# Patient Record
Sex: Male | Born: 1985 | Race: White | Hispanic: No | Marital: Married | Smoking: Never smoker
Health system: Southern US, Community
[De-identification: ages and names within clinical notes are randomized; demographics above are authoritative.]

## PROBLEM LIST (undated history)

## (undated) DIAGNOSIS — T7840XA Allergy, unspecified, initial encounter: Secondary | ICD-10-CM

## (undated) DIAGNOSIS — K219 Gastro-esophageal reflux disease without esophagitis: Secondary | ICD-10-CM

## (undated) DIAGNOSIS — J45909 Unspecified asthma, uncomplicated: Secondary | ICD-10-CM

## (undated) HISTORY — PX: OTHER SURGICAL HISTORY: SHX169

## (undated) HISTORY — DX: Unspecified asthma, uncomplicated: J45.909

## (undated) HISTORY — DX: Allergy, unspecified, initial encounter: T78.40XA

## (undated) HISTORY — DX: Gastro-esophageal reflux disease without esophagitis: K21.9

## (undated) HISTORY — PX: UPPER GI ENDOSCOPY: SHX6162

---

## 2011-03-19 ENCOUNTER — Emergency Department: Payer: Self-pay | Admitting: Emergency Medicine

## 2012-05-05 ENCOUNTER — Encounter (HOSPITAL_COMMUNITY): Payer: Self-pay | Admitting: Emergency Medicine

## 2012-05-05 ENCOUNTER — Emergency Department (HOSPITAL_COMMUNITY)
Admission: EM | Admit: 2012-05-05 | Discharge: 2012-05-05 | Disposition: A | Discharge status: Home / Self Care | Payer: BC Managed Care – PPO | Attending: Emergency Medicine | Admitting: Emergency Medicine

## 2012-05-05 ENCOUNTER — Emergency Department (HOSPITAL_COMMUNITY): Payer: BC Managed Care – PPO

## 2012-05-05 DIAGNOSIS — Z7982 Long term (current) use of aspirin: Secondary | ICD-10-CM | POA: Insufficient documentation

## 2012-05-05 DIAGNOSIS — F411 Generalized anxiety disorder: Secondary | ICD-10-CM | POA: Insufficient documentation

## 2012-05-05 DIAGNOSIS — R079 Chest pain, unspecified: Secondary | ICD-10-CM | POA: Insufficient documentation

## 2012-05-05 LAB — POCT I-STAT TROPONIN I

## 2012-05-05 LAB — CBC
HCT: 44.3 % (ref 39.0–52.0)
Hemoglobin: 15.7 g/dL (ref 13.0–17.0)
RBC: 5.05 MIL/uL (ref 4.22–5.81)
WBC: 8.5 10*3/uL (ref 4.0–10.5)

## 2012-05-05 LAB — BASIC METABOLIC PANEL
BUN: 13 mg/dL (ref 6–23)
Chloride: 102 mEq/L (ref 96–112)
Glucose, Bld: 106 mg/dL — ABNORMAL HIGH (ref 70–99)
Potassium: 3.9 mEq/L (ref 3.5–5.1)
Sodium: 139 mEq/L (ref 135–145)

## 2012-05-05 MED ORDER — LORAZEPAM 1 MG PO TABS
1.0000 mg | ORAL_TABLET | Freq: Once | ORAL | Status: AC
  Administered 2012-05-05: 1 mg via ORAL
  Filled 2012-05-05: qty 1

## 2012-05-05 MED ORDER — CLONAZEPAM 0.5 MG PO TABS: 0.5000 mg | ORAL_TABLET | Freq: Two times a day (BID) | ORAL | Status: DC | PRN

## 2012-05-05 NOTE — ED Notes (Signed)
Pt states that he has had dull, aching chest pain x 3 days.  Denies SOB.  Pt does not appear to be in any distress.  VSS.  Pain 2/10.

## 2012-05-05 NOTE — ED Notes (Signed)
PA student at bedside.

## 2012-05-05 NOTE — ED Notes (Signed)
Discharge instructions explained to pt ; pt verbalized understanding instructions.

## 2012-05-05 NOTE — ED Provider Notes (Signed)
History     CSN: [id number]  Arrival date & time 05/05/12  1435   First MD Initiated Contact with Patient 05/05/12 1604      Chief Complaint  Patient presents with  . Chest Pain    (Consider location/radiation/quality/duration/timing/severity/associated sxs/prior treatment) HPI Comments: Jonathan Sims is a 27 year old male patient with no cardiac history who presents to the ED complaining of aching, squeezing, non-radiating left sided chest pain that started on 05/03/12 and has been intermittent since that time. He states there's nothing that worsens or improves his chest pain and he begins having pain at random intermittent times.  His pain is a 2 on a scale of 10.  No sob, cough, fever, nausea, vomiting, diarrhea, abdominal pain, recent illness or recent trauma.  He states he does worry a lot and has some anxiety but has never been diagnosed with anxiety.  No recent drug or alcohol use.  He has acid reflux approximately one time per week that's relieved by Tums.    Patient is a 27 y.o. male presenting with chest pain.  Chest Pain Pertinent negatives for primary symptoms include no fever, no fatigue, no shortness of breath, no cough, no wheezing, no palpitations, no abdominal pain, no nausea, no vomiting and no dizziness.  Pertinent negatives for associated symptoms include no diaphoresis.     History reviewed. No pertinent past medical history.  History reviewed. No pertinent past surgical history.  History reviewed. No pertinent family history.  History  Substance Use Topics  . Smoking status: Never Smoker   . Smokeless tobacco: Not on file  . Alcohol Use: No      Review of Systems  Constitutional: Negative for fever, chills, diaphoresis, activity change, fatigue and unexpected weight change.  HENT: Negative for congestion, neck pain and neck stiffness.   Eyes: Negative for visual disturbance.  Respiratory: Negative for apnea, cough, chest tightness, shortness of breath,  wheezing and stridor.   Cardiovascular: Positive for chest pain. Negative for palpitations and leg swelling.  Gastrointestinal: Negative for nausea, vomiting, abdominal pain, diarrhea and blood in stool.  Genitourinary: Negative for dysuria, urgency, hematuria and flank pain.  Musculoskeletal: Negative for myalgias, back pain and gait problem.  Skin: Negative for pallor.  Neurological: Negative for dizziness, syncope, light-headedness and headaches.  Psychiatric/Behavioral: The patient is nervous/anxious.     Allergies  Review of patient's allergies indicates no known allergies.  Home Medications   Current Outpatient Rx  Name  Route  Sig  Dispense  Refill  . ASPIRIN 81 MG PO TBEC   Oral   Take 81 mg by mouth once. Swallow whole.           BP 144/88  Pulse 90  Temp 98.6 F (37 C) (Oral)  Resp 18  SpO2 99%  Physical Exam  Nursing note and vitals reviewed. Constitutional: He appears well-developed and well-nourished. No distress.  HENT:  Head: Normocephalic and atraumatic.  Eyes: Conjunctivae normal and EOM are normal. Pupils are equal, round, and reactive to light.  Neck: Normal range of motion. Neck supple. Normal carotid pulses and no JVD present. Carotid bruit is not present. No rigidity. Normal range of motion present.  Cardiovascular: Normal rate, regular rhythm, S1 normal, S2 normal, normal heart sounds, intact distal pulses and normal pulses.  Exam reveals no gallop and no friction rub.   No murmur heard.      No pitting edema bilaterally, RRR, no aberrant sounds on auscultations, distal pulses intact, no carotid bruit  or JVD.   Pulmonary/Chest: Effort normal and breath sounds normal. No accessory muscle usage or stridor. No respiratory distress. He exhibits no tenderness and no bony tenderness.  Abdominal: Bowel sounds are normal.       Soft non tender. Non pulsatile aorta.   Skin: Skin is warm, dry and intact. No rash noted. He is not diaphoretic. No cyanosis.  Nails show no clubbing.    ED Course  Procedures (including critical care time)  Labs Reviewed  BASIC METABOLIC PANEL - Abnormal; Notable for the following:    Glucose, Bld 106 (*)     All other components within normal limits  CBC  POCT I-STAT TROPONIN I   Dg Chest 2 View  05/05/2012  *RADIOLOGY REPORT*  Clinical Data: Chest pain  CHEST - 2 VIEW  Comparison: None.  Findings: The heart size and mediastinal contours are within normal limits.  Both lungs are clear.  The visualized skeletal structures are unremarkable.  IMPRESSION: Negative examination.   Original Report Authenticated By: Signa Kell, M.D.     Date: 05/05/2012  Rate: 79  Rhythm: normal sinus rhythm  QRS Axis: normal  Intervals: normal  ST/T Wave abnormalities: normal  Conduction Disutrbances: none  Narrative Interpretation:   Old EKG Reviewed: No significant changes noted     No diagnosis found.    MDM  Chest pain  Patient is to be discharged with recommendation to follow up with PCP in regards to today's hospital visit. Chest pain is not likely of cardiac or pulmonary etiology d/t presentation, perc negative, VSS, no tracheal deviation, no JVD or new murmur, RRR, breath sounds equal bilaterally, EKG without acute abnormalities, negative troponin, and negative CXR. Pt has been advised start a PPI and return to the ED is CP becomes exertional, associated with diaphoresis or nausea, radiates to left jaw/arm, worsens or becomes concerning in any way. Pt appears reliable for follow up and is agreeable to discharge.            Jaci Carrel, [place] 05/05/12 1711

## 2012-05-05 NOTE — ED Provider Notes (Signed)
Medical screening examination/treatment/procedure(s) were performed by non-physician practitioner and as supervising physician I was immediately available for consultation/collaboration.   Laray Anger, DO 05/05/12 2045

## 2015-06-06 ENCOUNTER — Emergency Department (HOSPITAL_COMMUNITY)
Admission: EM | Admit: 2015-06-06 | Discharge: 2015-06-07 | Disposition: A | Discharge status: Home / Self Care | Payer: BLUE CROSS/BLUE SHIELD | Attending: Emergency Medicine | Admitting: Emergency Medicine

## 2015-06-06 ENCOUNTER — Encounter (HOSPITAL_COMMUNITY): Payer: Self-pay | Admitting: Emergency Medicine

## 2015-06-06 DIAGNOSIS — Z7951 Long term (current) use of inhaled steroids: Secondary | ICD-10-CM | POA: Insufficient documentation

## 2015-06-06 DIAGNOSIS — J45909 Unspecified asthma, uncomplicated: Secondary | ICD-10-CM | POA: Diagnosis not present

## 2015-06-06 DIAGNOSIS — R0789 Other chest pain: Secondary | ICD-10-CM

## 2015-06-06 DIAGNOSIS — Z79899 Other long term (current) drug therapy: Secondary | ICD-10-CM | POA: Insufficient documentation

## 2015-06-06 DIAGNOSIS — R079 Chest pain, unspecified: Secondary | ICD-10-CM | POA: Diagnosis present

## 2015-06-06 NOTE — ED Notes (Signed)
Patient presents for intermittent centralized chest pain x1 week. Denies radiation, N/V/D, lightheadedness, dizziness, diaphoresis. Rates pain 4/10.

## 2015-06-07 ENCOUNTER — Emergency Department (HOSPITAL_COMMUNITY): Payer: BLUE CROSS/BLUE SHIELD

## 2015-06-07 LAB — CBC
HEMATOCRIT: 45.1 % (ref 39.0–52.0)
Hemoglobin: 14.8 g/dL (ref 13.0–17.0)
MCH: 29.8 pg (ref 26.0–34.0)
MCHC: 32.8 g/dL (ref 30.0–36.0)
MCV: 90.9 fL (ref 78.0–100.0)
Platelets: 180 10*3/uL (ref 150–400)
RBC: 4.96 MIL/uL (ref 4.22–5.81)
RDW: 12.4 % (ref 11.5–15.5)
WBC: 8.6 10*3/uL (ref 4.0–10.5)

## 2015-06-07 LAB — BASIC METABOLIC PANEL
Anion gap: 10 (ref 5–15)
BUN: 22 mg/dL — AB (ref 6–20)
CHLORIDE: 108 mmol/L (ref 101–111)
CO2: 24 mmol/L (ref 22–32)
Calcium: 9.4 mg/dL (ref 8.9–10.3)
Creatinine, Ser: 0.9 mg/dL (ref 0.61–1.24)
GFR calc Af Amer: >60 mL/min (ref >60)
GFR calc non Af Amer: >60 mL/min (ref >60)
Glucose, Bld: 109 mg/dL — ABNORMAL HIGH (ref 65–99)
POTASSIUM: 4.1 mmol/L (ref 3.5–5.1)
Sodium: 142 mmol/L (ref 135–145)

## 2015-06-07 LAB — I-STAT TROPONIN, ED: Troponin i, poc: 0 ng/mL (ref 0.00–0.08)

## 2015-06-07 MED ORDER — KETOROLAC TROMETHAMINE 30 MG/ML IJ SOLN
30.0000 mg | Freq: Once | INTRAMUSCULAR | Status: AC
  Administered 2015-06-07: 30 mg via INTRAVENOUS
  Filled 2015-06-07: qty 1

## 2015-06-07 NOTE — ED Provider Notes (Signed)
CSN: [id number]     Arrival date & time 06/06/15  2346 History  By signing my name below, I, Marisue Humble, attest that this documentation has been prepared under the direction and in the presence of Devoria Albe, MD at 02:05 . Electronically Signed: Marisue Humble, Scribe. 06/07/2015. 3:18 AM.   Chief Complaint  Patient presents with  . Chest Pain   The history is provided by the patient. No language interpreter was used.   HPI Comments:  Jonathan Sims is a 30 y.o. male with PMHx of asthma who presents to the Emergency Department complaining of intermittent, dull, achy left-side chest pain that started 1.5 weeks ago, with episodes lasting 2-3 minutes. Last episode in ER occurred ~5-10 min ago; pt reports no pain currently. No alleviating or exacerbating factors noted. He notes experiencing similar symptoms 3 years ago with no acute dx except stress. 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 Pt notes baseline asthma symptoms treated well with inhaler. Pt denies FHx of heart problems. Pt denies SOB, coughing, fever, nausea, vomiting, diarrhea, sore throat or rhinorrhea.   PCP [facility] in [place]  History reviewed. No pertinent past medical history. History reviewed. No pertinent past surgical history. No family history on file. Social History  Substance Use Topics  . Smoking status: Never Smoker   . Smokeless tobacco: None  . Alcohol Use: No  employed  Review of Systems  Constitutional: Negative for fever.  HENT: Negative for sore throat.   Respiratory: Negative for cough and shortness of breath.   Cardiovascular: Positive for chest pain.  Gastrointestinal: Negative for nausea, vomiting and diarrhea.  All other systems reviewed and are negative.  Allergies  Review of patient's allergies indicates no known allergies.  Home Medications   Prior to Admission medications   Medication Sig Start Date End Date Taking? Authorizing Provider  albuterol (PROVENTIL  HFA;VENTOLIN HFA) 108 (90 Base) MCG/ACT inhaler Inhale 1 puff into the lungs every 6 (six) hours as needed for wheezing or shortness of breath.   Yes Historical Provider, MD  budesonide-formoterol (SYMBICORT) 160-4.5 MCG/ACT inhaler Inhale 2 puffs into the lungs 2 (two) times daily.   Yes Historical Provider, MD  ibuprofen (ADVIL,MOTRIN) 200 MG tablet Take 400-600 mg by mouth every 6 (six) hours as needed for moderate pain.   Yes Historical Provider, MD  ranitidine (ZANTAC) 150 MG tablet Take 150 mg by mouth 2 (two) times daily.   Yes Historical Provider, MD  clonazePAM (KLONOPIN) 0.5 MG tablet Take 1 tablet (0.5 mg total) by mouth 2 (two) times daily as needed for anxiety. Patient not taking: Reported on 06/07/2015 05/05/12   Lisette Paz, PA-C   BP 133/70 mmHg  Pulse 91  Temp(Src) 98.1 F (36.7 C) (Oral)  Resp 17  SpO2 100%  Vital signs normal   Physical Exam  Constitutional: He is oriented to person, place, and time. He appears well-developed and well-nourished.  Non-toxic appearance. He does not appear ill. No distress.  HENT:  Head: Normocephalic and atraumatic.  Right Ear: External ear normal.  Left Ear: External ear normal.  Nose: Nose normal. No mucosal edema or rhinorrhea.  Mouth/Throat: Oropharynx is clear and moist and mucous membranes are normal. No dental abscesses or uvula swelling.  Eyes: Conjunctivae and EOM are normal. Pupils are equal, round, and reactive to light.  Neck: Normal range of motion and full passive range of motion without pain. Neck supple.  Cardiovascular: Normal rate, regular rhythm and normal heart sounds.  Exam reveals no gallop and no friction rub.   No murmur heard. Pulmonary/Chest: Effort normal and breath sounds normal. No respiratory distress. He has no wheezes. He has no rhonchi. He has no rales. He exhibits no tenderness and no crepitus.  Abdominal: Soft. Normal appearance and bowel sounds are normal. He exhibits no distension. There is no  tenderness. There is no rebound and no guarding.  Musculoskeletal: Normal range of motion. He exhibits no edema or tenderness.  Moves all extremities well.   Neurological: He is alert and oriented to person, place, and time. He has normal strength. No cranial nerve deficit.  Skin: Skin is warm, dry and intact. No rash noted. No erythema. No pallor.  Psychiatric: He has a normal mood and affect. His speech is normal and behavior is normal. His mood appears not anxious.  Nursing note and vitals reviewed.  ED Course  Procedures   Medications  ketorolac (TORADOL) 30 MG/ML injection 30 mg (30 mg Intravenous Given 06/07/15 0224)    DIAGNOSTIC STUDIES:  Oxygen Saturation is 100% on RA, normal by my interpretation.    COORDINATION OF CARE:  2:12 AM Discussed lab and imaging results with pt. Will administer medication prior to discharge. Discussed treatment plan with pt at bedside and pt agreed to plan. Patient was given IV Toradol for his atypical chest pain felt to be musculoskeletal more likely exacerbated by stress.  Labs Review Results for orders placed or performed during the hospital encounter of 06/06/15  Basic metabolic panel  Result Value Ref Range   Sodium 142 135 - 145 mmol/L   Potassium 4.1 3.5 - 5.1 mmol/L   Chloride 108 101 - 111 mmol/L   CO2 24 22 - 32 mmol/L   Glucose, Bld 109 (H) 65 - 99 mg/dL   BUN 22 (H) 6 - 20 mg/dL   Creatinine, Ser 4.09 0.61 - 1.24 mg/dL   Calcium 9.4 8.9 - 81.1 mg/dL   GFR calc non Af Amer >60 >60 mL/min   GFR calc Af Amer >60 >60 mL/min   Anion gap 10 5 - 15  CBC  Result Value Ref Range   WBC 8.6 4.0 - 10.5 K/uL   RBC 4.96 4.22 - 5.81 MIL/uL   Hemoglobin 14.8 13.0 - 17.0 g/dL   HCT 91.4 78.2 - 95.6 %   MCV 90.9 78.0 - 100.0 fL   MCH 29.8 26.0 - 34.0 pg   MCHC 32.8 30.0 - 36.0 g/dL   RDW 21.3 08.6 - 57.8 %   Platelets 180 150 - 400 K/uL  I-stat troponin, ED (not at [facility])  Result Value Ref Range   Troponin i, poc 0.00 0.00 - 0.08  ng/mL   Comment 3            Laboratory interpretation all normal Dg Chest 2 View  06/07/2015  CLINICAL DATA:  30 year old male with intermittent central chest pain. EXAM: CHEST  2 VIEW COMPARISON:  Radiograph dated 05/05/2012 FINDINGS: The heart size and mediastinal contours are within normal limits. Both lungs are clear. The visualized skeletal structures are unremarkable. IMPRESSION: No active cardiopulmonary disease. Electronically Signed   By: Elgie Collard M.D.   On: 06/07/2015 00:25    EKG Interpretation  Date/Time:  Saturday June 06 2015 23:57:20 EST Ventricular Rate:  89 PR Interval:  176 QRS Duration: 96 QT Interval:  348 QTC Calculation: 423 R Axis:   58 Text Interpretation:  Sinus rhythm Inferior Q waves noted probable normal  variant No significant  change since last tracing 05 May 2012 Confirmed by  [facility]  MD-I, Aser Nylund ([id number]) on 06/07/2015 12:02:10 AM     Medications  ketorolac (TORADOL) 30 MG/ML injection 30 mg (30 mg Intravenous Given 06/07/15 0224)    MDM   Final diagnoses:  Atypical chest pain   Patient to take over-the-counter Motrin or Aleve for his pain.  Plan discharge  .Devoria Albe, MD, FACEP    I personally performed the services described in this documentation, which was scribed in my presence. The recorded information has been reviewed and considered.  Devoria Albe, MD, Concha Pyo, MD 06/07/15 [phone]

## 2015-06-07 NOTE — Discharge Instructions (Signed)
You can take ibuprofen 600 mg 4 times a day OR aleve 2 tabs twice a day for pain. Recheck if you get a fever, struggle to breathe or have severe constant pain lasting more than 30 mintues.    Chest Wall Pain Chest wall pain is pain in or around the bones and muscles of your chest. Sometimes, an injury causes this pain. Sometimes, the cause may not be known. This pain may take several weeks or longer to get better. HOME CARE Pay attention to any changes in your symptoms. Take these actions to help with your pain:  Rest as told by your doctor.  Avoid activities that cause pain. Try not to use your chest, belly (abdominal), or side muscles to lift heavy things.  If directed, apply ice to the painful area:  Put ice in a plastic bag.  Place a towel between your skin and the bag.  Leave the ice on for 20 minutes, 2-3 times per day.  Take over-the-counter and prescription medicines only as told by your doctor.  Do not use tobacco products, including cigarettes, chewing tobacco, and e-cigarettes. If you need help quitting, ask your doctor.  Keep all follow-up visits as told by your doctor. This is important. GET HELP IF:  You have a fever.  Your chest pain gets worse.  You have new symptoms. GET HELP RIGHT AWAY IF:  You feel sick to your stomach (nauseous) or you throw up (vomit).  You feel sweaty or light-headed.  You have a cough with phlegm (sputum) or you cough up blood.  You are short of breath.   This information is not intended to replace advice given to you by your health care provider. Make sure you discuss any questions you have with your health care provider.   Document Released: 09/28/2007 Document Revised: 12/31/2014 Document Reviewed: 07/07/2014 Elsevier Interactive Patient Education [employer]

## 2015-08-08 DIAGNOSIS — F419 Anxiety disorder, unspecified: Secondary | ICD-10-CM | POA: Insufficient documentation

## 2015-08-10 DIAGNOSIS — E669 Obesity, unspecified: Secondary | ICD-10-CM | POA: Insufficient documentation

## 2015-08-10 DIAGNOSIS — E66811 Obesity, class 1: Secondary | ICD-10-CM | POA: Insufficient documentation

## 2016-05-05 ENCOUNTER — Encounter (INDEPENDENT_AMBULATORY_CARE_PROVIDER_SITE_OTHER): Payer: Self-pay

## 2016-05-05 ENCOUNTER — Ambulatory Visit: Payer: Self-pay | Admitting: Allergy & Immunology

## 2016-05-05 ENCOUNTER — Ambulatory Visit (INDEPENDENT_AMBULATORY_CARE_PROVIDER_SITE_OTHER): Payer: BLUE CROSS/BLUE SHIELD | Admitting: Allergy & Immunology

## 2016-05-05 ENCOUNTER — Encounter: Payer: Self-pay | Admitting: Allergy & Immunology

## 2016-05-05 VITALS — BP 150/88 | HR 74 | Temp 98.1°F | Resp 18 | Ht 70.0 in | Wt 259.8 lb

## 2016-05-05 DIAGNOSIS — J3089 Other allergic rhinitis: Secondary | ICD-10-CM

## 2016-05-05 DIAGNOSIS — J4540 Moderate persistent asthma, uncomplicated: Secondary | ICD-10-CM

## 2016-05-05 NOTE — Patient Instructions (Addendum)
1. Moderate persistent asthma, uncomplicated - Lung testing was normal today. - However since you're using her albuterol so frequently, we will change it to a different medication: Symbicort 160/4.5 - Daily controller medication(s): Symbicort 160/4.5 two puffs twice daily via spacer - Rescue medications: ProAir 4 puffs every 4-6 hours as needed - Asthma control goals:  * Full participation in all desired activities (may need albuterol before activity) * Albuterol use two time or less a week on average (not counting use with activity) * Cough interfering with sleep two time or less a month * Oral steroids no more than once a year * No hospitalizations  2. Chronic rhinitis - Testing was positive to grasses, weeds, trees, molds, cat, dog, dust mite, cockroach - Start Flonase one spray per nostril daily. - Start Astelin one spray per nostril daily. - Use an antihistamine daily for breakthrough symptoms as needed (Zyrtec, Allegra, or Xyzal) - Consider allergy shots if there is no improvement.  - Call you insurance company to confirm coverage.  - You can purchase Flonase (fluticasone) on [employer] for very reasonable prices. - You can also purchase antihistamines (cetirizine and fexofenadine) on [employer] for reasonable prices.  3. Return in about 4 weeks (around 06/02/2016).  Please inform us of any Emergency Department visits, hospitalizations, or changes in symptoms. Call us before going to the ED for breathing or allergy symptoms since we might be able to fit you in for a sick visit. Feel free to contact us anytime with any questions, problems, or concerns.  It was a pleasure to meet you today! Best wishes in the [place] Year!   Websites that have reliable patient information: 1. American Academy of Asthma, Allergy, and Immunology: www.aaaai.org 2. Food Allergy Research and Education (FARE): foodallergy.org 3. Mothers of Asthmatics: http://www.asthmacommunitynetwork.org 4. American College of  Allergy, Asthma, and Immunology: www.acaai.org  Control of Mold Allergen  Mold and fungi can grow on a variety of surfaces provided certain temperature and moisture conditions exist.  Outdoor molds grow on plants, decaying vegetation and soil.  The major outdoor mold, Alternaria and Cladosporium, are found in very high numbers during hot and dry conditions.  Generally, a late Summer - Fall peak is seen for common outdoor fungal spores.  Rain will temporarily lower outdoor mold spore count, but counts rise rapidly when the rainy period ends.  The most important indoor molds are Aspergillus and Penicillium.  Dark, humid and poorly ventilated basements are ideal sites for mold growth.  The next most common sites of mold growth are the bathroom and the kitchen.  Outdoor [employer] 1. Use air conditioning and keep windows closed 2. Avoid exposure to decaying vegetation. 3. Avoid leaf raking. 4. Avoid grain handling. 5. Consider wearing a face mask if working in moldy areas.  Indoor Mold Control 1. Maintain humidity below 50%. 2. Clean washable surfaces with 5% bleach solution. 3. Remove sources e.g. contaminated carpets.  Reducing Pollen Exposure  The American Academy of Allergy, Asthma and Immunology suggests the following steps to reduce your exposure to pollen during allergy seasons.    1. Do not hang sheets or clothing out to dry; pollen may collect on these items. 2. Do not mow lawns or spend time around freshly cut grass; mowing stirs up pollen. 3. Keep windows closed at night.  Keep car windows closed while driving. 4. Minimize morning activities outdoors, a time when pollen counts are usually at their highest. 5. Stay indoors as much as possible when pollen counts or  humidity is high and on windy days when pollen tends to remain in the air longer. 6. Use air conditioning when possible.  Many air conditioners have filters that trap the pollen spores. 7. Use a HEPA room air filter to  remove pollen form the indoor air you breathe.  Control of Dog or Cat Allergen  Avoidance is the best way to manage a dog or cat allergy. If you have a dog or cat and are allergic to dog or cats, consider removing the dog or cat from the home. If you have a dog or cat but don't want to find it a new home, or if your family wants a pet even though someone in the household is allergic, here are some strategies that may help keep symptoms at bay:  1. Keep the pet out of your bedroom and restrict it to only a few rooms. Be advised that keeping the dog or cat in only one room will not limit the allergens to that room. 2. Don't pet, hug or kiss the dog or cat; if you do, wash your hands with soap and water. 3. High-efficiency particulate air (HEPA) cleaners run continuously in a bedroom or living room can reduce allergen levels over time. 4. Regular use of a high-efficiency vacuum cleaner or a central vacuum can reduce allergen levels. 5. Giving your dog or cat a bath at least once a week can reduce airborne allergen.  Control of Cockroach Allergen  Cockroach allergen has been identified as an important cause of acute attacks of asthma, especially in urban settings.  There are fifty-five species of cockroach that exist in the [place], however only three, the [place], [place] and Oriental species produce allergen that can affect patients with Asthma.  Allergens can be obtained from fecal particles, egg casings and secretions from cockroaches.    1. Remove food sources. 2. Reduce access to water. 3. Seal access and entry points. 4. Spray runways with 0.5-1% Diazinon or Chlorpyrifos 5. Blow boric acid power under stoves and refrigerator. 6. Place bait stations (hydramethylnon) at feeding sites.

## 2016-05-05 NOTE — Progress Notes (Signed)
NEW PATIENT  Date of Service/Encounter:  05/05/16   Assessment:   Moderate persistent asthma, uncomplicated  Chronic nonseasonal allergic rhinitis due to fungal spores   Elevated blood pressure - not on antihypertensives   Asthma Reportables:  Severity: moderate persistent  Risk: low Control: not well controlled  Seasonal Influenza Vaccine: yes    Plan/Recommendations:   1. Moderate persistent asthma, uncomplicated - Lung testing was normal today. - However since you're using her albuterol so frequently, we will change it to a different medication: Symbicort 160/4.5 - Daily controller medication(s): Symbicort 160/4.5 two puffs twice daily via spacer - Rescue medications: ProAir 4 puffs every 4-6 hours as needed - Asthma control goals:  * Full participation in all desired activities (may need albuterol before activity) * Albuterol use two time or less a week on average (not counting use with activity) * Cough interfering with sleep two time or less a month * Oral steroids no more than once a year * No hospitalizations  2. Chronic allergic rhinitis - Testing was positive to grasses, weeds, trees, molds, cat, dog, dust mite, cockroach - Start Flonase one spray per nostril daily. - Start Astelin one spray per nostril daily. - Use an antihistamine daily for breakthrough symptoms as needed (Zyrtec, Allegra, or Xyzal) - Consider allergy shots if there is no improvement.  - Call you insurance company to confirm coverage.  - You can purchase Flonase (fluticasone) on [employer] for very reasonable prices. - You can also purchase antihistamines (cetirizine and fexofenadine) on [employer] for reasonable prices.  3. Elevated blood pressure - Defer to PCP for management.  - I recommended that Jonathan Sims talk to his PCP about this.  4. Return in about 4 weeks (around 06/02/2016).   Subjective:   Jonathan Sims is a 31 y.o. male presenting today for evaluation of  Chief Complaint    Patient presents with  . Allergy Testing    Environmental    Jonathan Sims has a history of the following: There are no active problems to display for this patient.   History obtained from: chart review and patient.  Jonathan Sims was referred by Nonnie Done., MD.     Jonathan Sims is a 31 y.o. male presenting for an asthma and allergy evaluation. The history was obtained from the EMR as well as the patient. Jonathan Sims has had asthma for a long period of time. He has never been evaluated by an asthma and allergy specialist. He has been on Flovent Diskus for a long time, although he does not feel that it is working well.   Lately, Jonathan Sims has been having SOB since November that does help with administration of the ProAir. He has also been on Singulair 10 mg for a few months, however he has not taken it in a month and feels no worse. He has been using his ProAir are fairly routinely multiple times a day since November. He is required no ER visits or prednisone courses aside from one course of steroids in November. The steroids do seem to help his symptoms. Sometimes the albuterol helps another 10 to provides no relief. He does not like how extensive the Flovent is a $40 per month. He has never been hospitalized for his asthma.  Jonathan Sims reports nasal congestion throughout the year. This seems to come and go and he can occasionally breathe through his nose. He has been using Claritin and Allegra without improvement. He has been on Flonase but this was  a while ago. He did not like the feel of the medication but did not mind the scent. He is not using nasal saline currently. He does have itchy watery eyes that occur randomly. This occurs around 1-2 times per week. There is a cat at home but he is unsure whether this is worsening his symptoms. Symptoms do tend to be worse on the weekends when he goes to his dad's home who has three cats. Cigarette smoking tends to make things worse.   He does  have a history of elevated blood pressure, but he has never been started on a medication. He has a history of food allergies and tolerates all of the 8 major food allergens. Otherwise, there is no history of other atopic diseases, including asthma, drug allergies, food allergies, environmental allergies, stinging insect allergies, or urticaria. There is no significant infectious history. Vaccinations are up to date.    Past Medical History: There are no active problems to display for this patient.   Medication List:  Allergies as of 05/05/2016   No Known Allergies     Medication List       Accurate as of 05/05/16  4:53 PM. Always use your most recent med list.          albuterol 108 (90 Base) MCG/ACT inhaler Commonly known as:  PROVENTIL HFA;VENTOLIN HFA Inhale 1 puff into the lungs every 6 (six) hours as needed for wheezing or shortness of breath.   ibuprofen 200 MG tablet Commonly known as:  ADVIL,MOTRIN Take 400-600 mg by mouth every 6 (six) hours as needed for moderate pain.   ranitidine 150 MG tablet Commonly known as:  ZANTAC Take 150 mg by mouth 2 (two) times daily.       Birth History: non-contributory.   Developmental History: non-contributory.   Past Surgical History: Past Surgical History:  Procedure Laterality Date  . Wisdom surgery       Family History: Family History  Problem Relation Age of Onset  . Allergic rhinitis Father      Social History: Jonathan Sims lives at home with his girlfriend. 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 He has been there for three years. Prior to that, he was working in [employer]. He is always worked in very dusty environments, but his symptoms have acutely worsened over the past 2-3 years.   Review of Systems: a 14-point review of systems is pertinent for what is mentioned in HPI.  Otherwise, all other systems were negative. Constitutional: negative  other than that listed in the HPI Eyes: negative other than that listed in the HPI Ears, nose, mouth, throat, and face: negative other than that listed in the HPI Respiratory: negative other than that listed in the HPI Cardiovascular: negative other than that listed in the HPI Gastrointestinal: negative other than that listed in the HPI Genitourinary: negative other than that listed in the HPI Integument: negative other than that listed in the HPI Hematologic: negative other than that listed in the HPI Musculoskeletal: negative other than that listed in the HPI Neurological: negative other than that listed in the HPI Allergy/Immunologic: negative other than that listed in the HPI    Objective:   Blood pressure (!) 150/88, pulse 74, temperature 98.1 F (36.7 C), temperature source Oral, resp. rate 18, height 5\' 10"  (1.778 m), weight 259 lb 12.8 oz (117.8 kg), SpO2 96 %. Body mass index is 37.28 kg/m.   Physical Exam:  General: Alert, interactive, in no acute distress. Obese male. Cooperative with exam. Eyes: Conjunctival injection on the right with limbal sparing, Conjunctival injection on the left with limbal sparing, PERRL bilaterally, No discharge on the right, No discharge on the left and No Horner-Trantas dots present Ears: Right TM unable to be visualized due to cerumen impaction and Left TM unable to be visualized due to cerumen impaction.  Nose/Throat: External nose within normal limits, nasal crease present and septum midline, turbinates markedly edematous and pale with clear discharge, post-pharynx markedly erythematous with cobblestoning in the posterior oropharynx. Tonsils 3+ without exudates Neck: Supple without thyromegaly. Adenopathy: no enlarged lymph nodes appreciated in the anterior cervical, occipital, axillary, epitrochlear, inguinal, or popliteal regions Lungs: Clear to auscultation without wheezing, rhonchi or rales. No increased work of breathing. CV: Normal  S1/S2, no murmurs. Capillary refill <2 seconds.  Abdomen: Nondistended, nontender. No guarding or rebound tenderness. Bowel sounds faint and present in all fields  Skin: Warm and dry, without lesions or rashes. Extremities:  No clubbing, cyanosis or edema. Neuro:   Grossly intact. No focal deficits appreciated. Responsive to questions.  Diagnostic studies:  Spirometry: results normal (FEV1: 4.12/93%, FVC: 5.85/110%, FEV1/FVC: 70%).    Spirometry consistent with normal pattern.   Allergy Studies:   Indoor/Outdoor Percutaneous Adult Environmental Panel: positive to bahia grass, [place] grass, johnson grass, [place] blue grass, meadow fescue grass, perennial rye grass, sweet vernal grass, timothy grass, cocklebur, short ragweed, giant ragweed, English plantain, sheep sorrel, common mugwort, birch, Box elder, red cedar, [place] cottonwood, elm, oak, pecan pollen, black walnut pollen, Alternaria, Cladosporium, Aspergillus, Fusarium and cat. Otherwise negative with adequate controls.  Indoor/Outdoor Selected Intradermal Environmental Panel: positive to dog, cockroach and mite mix. Otherwise negative with adequate controls.      Malachi Bonds, MD FAAAAI [facility]

## 2016-05-06 MED ORDER — AZELASTINE HCL 0.1 % NA SOLN
1.0000 | Freq: Two times a day (BID) | NASAL | 12 refills | Status: DC
Start: 2016-05-06 — End: 2017-08-17

## 2016-05-06 NOTE — Addendum Note (Signed)
Addended by: Mliss FritzBLACK, Bryden Darden I on: 05/06/2016 04:46 PM   Modules accepted: Orders

## 2016-05-16 ENCOUNTER — Other Ambulatory Visit: Payer: Self-pay | Admitting: Allergy & Immunology

## 2016-05-16 MED ORDER — BUDESONIDE-FORMOTEROL FUMARATE 160-4.5 MCG/ACT IN AERO: 2.0000 | INHALATION_SPRAY | Freq: Two times a day (BID) | RESPIRATORY_TRACT | 5 refills | Status: DC

## 2016-05-16 NOTE — Telephone Encounter (Signed)
Called patient and informed that we sent the Symbicort 160 to [facility] on [address].

## 2016-05-16 NOTE — Telephone Encounter (Signed)
Patient saw Dr. Dellis AnesGallagher on 05-05-16. He was given a sample of Symbicort to see if it helped it did and patient would like a prescription called in for it to [facility] on [address].

## 2016-06-02 ENCOUNTER — Ambulatory Visit (INDEPENDENT_AMBULATORY_CARE_PROVIDER_SITE_OTHER): Payer: BLUE CROSS/BLUE SHIELD | Admitting: Allergy & Immunology

## 2016-06-02 ENCOUNTER — Encounter: Payer: Self-pay | Admitting: Allergy & Immunology

## 2016-06-02 VITALS — BP 132/88 | HR 84 | Temp 98.0°F | Ht 70.0 in | Wt 259.7 lb

## 2016-06-02 DIAGNOSIS — J4540 Moderate persistent asthma, uncomplicated: Secondary | ICD-10-CM

## 2016-06-02 DIAGNOSIS — K219 Gastro-esophageal reflux disease without esophagitis: Secondary | ICD-10-CM

## 2016-06-02 DIAGNOSIS — J3089 Other allergic rhinitis: Secondary | ICD-10-CM

## 2016-06-02 NOTE — Patient Instructions (Addendum)
1. Moderate persistent asthma, uncomplicated - Lung testing was normal today. - We will not make any medication changes today.  - Daily controller medication(s): Symbicort 160/4.5 two puffs twice daily via spacer - Rescue medications: ProAir 4 puffs every 4-6 hours as needed - Asthma control goals:  * Full participation in all desired activities (may need albuterol before activity) * Albuterol use two time or less a week on average (not counting use with activity) * Cough interfering with sleep two time or less a month * Oral steroids no more than once a year * No hospitalizations  2. Chronic rhinitis (grasses, weeds, trees, molds, cat, dog, dust mite, cockroach) - Continue with Flonase one spray per nostril daily. - Continue with an antihistamine daily for breakthrough symptoms as needed (Zyrtec, Allegra, or Xyzal) - Consider allergy shots if there is no improvement.  - You can purchase Flonase (fluticasone) on [employer] for very reasonable prices. - You can also purchase antihistamines (cetirizine and fexofenadine) on [employer] for reasonable prices.  3. Abdominal pain - reflux?  - Stop the Zantac and start Dexilant 30mg  once daily. - Call us in a couple of weeks to let us know if this provides better control. - We will send in a prescription at that time.   4. Return in about 6 months (around 11/30/2016).  Please inform us of any Emergency Department visits, hospitalizations, or changes in symptoms. Call us before going to the ED for breathing or allergy symptoms since we might be able to fit you in for a sick visit. Feel free to contact us anytime with any questions, problems, or concerns.  It was a pleasure to see you again today! Best wishes in the [place] Year!   Websites that have reliable patient information: 1. American Academy of Asthma, Allergy, and Immunology: www.aaaai.org 2. Food Allergy Research and Education (FARE): foodallergy.org 3. Mothers of Asthmatics:  http://www.asthmacommunitynetwork.org 4. American College of Allergy, Asthma, and Immunology: www.acaai.org

## 2016-06-02 NOTE — Progress Notes (Signed)
FOLLOW UP  Date of Service/Encounter:  06/02/16   Assessment:   Moderate persistent asthma, uncomplicated  Chronic nonseasonal allergic rhinitis   Gastroesophageal reflux disease   Asthma Reportables:  Severity: moderate persistent  Risk: low Control: well controlled  Seasonal Influenza Vaccine: yes    Plan/Recommendations:   1. Moderate persistent asthma, uncomplicated - Lung testing was normal today. - We will not make any medication changes today.  - Daily controller medication(s): Symbicort 160/4.5 two puffs twice daily via spacer - Rescue medications: ProAir 4 puffs every 4-6 hours as needed - Asthma control goals:  * Full participation in all desired activities (may need albuterol before activity) * Albuterol use two time or less a week on average (not counting use with activity) * Cough interfering with sleep two time or less a month * Oral steroids no more than once a year * No hospitalizations  2. Chronic rhinitis (grasses, weeds, trees, molds, cat, dog, dust mite, cockroach) - Continue with Flonase one spray per nostril daily. - Continue with an antihistamine daily for breakthrough symptoms as needed (Zyrtec, Allegra, or Xyzal) - Consider allergy shots if there is no improvement.  - You can purchase Flonase (fluticasone) on [employer] for very reasonable prices. - You can also purchase antihistamines (cetirizine and fexofenadine) on [employer] for reasonable prices.  3. Abdominal pain - reflux?  - Stop the Zantac and start Dexilant 30mg  once daily. - Call us in a couple of weeks to let us know if this provides better control. - We will send in a prescription at that time.   4. Return in about 6 months (around 11/30/2016).   Subjective:   Jonathan Sims is a 31 y.o. male presenting today for follow up of  Chief Complaint  Patient presents with  . Allergic Rhinitis     Well controlled by nasal sprays and Zrytec.     Jonathan Sims has a history of the  following: Patient Active Problem List   Diagnosis Date Noted  . Chronic nonseasonal allergic rhinitis due to fungal spores 05/05/2016  . Moderate persistent asthma, uncomplicated 05/05/2016    History obtained from: chart review and patient.  Jonathan Sims was referred by Nonnie Done., MD.     Esvin is a 31 y.o. male presenting for a follow up visit. He was last seen approximately one month ago. This was his first visit and he had allergy testing that was positive to multiple allergens including grasses, weeds, trees, molds, cat, dog, dust mite, and cockroach. He was hesitant to start allergen immunotherapy due to the cost. I recommended starting Flonase 1 spray per nostril daily as well as Astelin 1 spray per nostril daily. I recommended an antihistamine for breakthrough symptoms. For his asthma, we started him on Symbicort 160/4.5 2 inhalations in the morning and 2 inhalations at night with a spacer. He did have a normal spirometry, but he was using albuterol quite frequently.  Since last visit, he reports that he has done well. He feels that the Symbicort is providing excellent control of his symptoms. In order to make the sample from the last visit last longer, he continued to use it for about 1 week even after the counter red 0. He does use a spacer. After the counter went down to 0, he started to have subcostal midline discomfort, which was especially prominent after eating. He does have a history of reflux and is on Zantac 1-2 times per day. He finally filled his Symbicort and  shortly after starting the new inhaler, the subcostal pain seemed to go away. However, the discomfort will occasionally flare. He rates the pain as 8 out of 10 in intensity.  From an allergic rhinitis perspective, he reports that the combination of saline, Flonase, and Zyrtec provides great control of his symptoms. He has been able to breathe through his nose and his sense of smell has returned. He does continue to  have some nasal discharge but overall is quite happy with how he is doing.  [name] is still looking for a primary care provider. He did have an elevated blood pressure at the last visit, but the one today is much more normal. He is not on any antihypertensives at this time.  Otherwise, there have been no changes to his past medical history, surgical history, family history, or social history.    Review of Systems: a 14-point review of systems is pertinent for what is mentioned in HPI.  Otherwise, all other systems were negative. Constitutional: negative other than that listed in the HPI Eyes: negative other than that listed in the HPI Ears, nose, mouth, throat, and face: negative other than that listed in the HPI Respiratory: negative other than that listed in the HPI Cardiovascular: negative other than that listed in the HPI Gastrointestinal: negative other than that listed in the HPI Genitourinary: negative other than that listed in the HPI Integument: negative other than that listed in the HPI Hematologic: negative other than that listed in the HPI Musculoskeletal: negative other than that listed in the HPI Neurological: negative other than that listed in the HPI Allergy/Immunologic: negative other than that listed in the HPI    Objective:   Blood pressure 132/88, pulse 84, temperature 98 F (36.7 C), temperature source Oral, height 5\' 10"  (1.778 m), weight 259 lb 11.2 oz (117.8 kg), SpO2 96 %. Body mass index is 37.26 kg/m.   Physical Exam:  General: Alert, interactive, in no acute distress.Cooperative with the exam. Very appreciative. Eyes: No conjunctival injection present on the right, No conjunctival injection present on the left, PERRL bilaterally, No discharge on the right, No discharge on the left, No Horner-Trantas dots present and allergic shiners present bilaterally Ears: Right TM pearly gray with normal light reflex, Left TM pearly gray with normal light reflex, Right  TM intact without perforation and Left TM intact without perforation.  Nose/Throat: External nose within normal limits, nasal crease present and septum midline, turbinates edematous and pale with clear discharge, post-pharynx erythematous without cobblestoning in the posterior oropharynx. Tonsils 2+ without exudates Neck: Supple without thyromegaly. Lungs: Clear to auscultation without wheezing, rhonchi or rales. No increased work of breathing. CV: Normal S1/S2, no murmurs. Capillary refill <2 seconds.  Skin: Warm and dry, without lesions or rashes. Neuro:   Grossly intact. No focal deficits appreciated. Responsive to questions.   Diagnostic studies:   Spirometry: results normal (FEV1: 3.70/84%, FVC: 4.99/94%, FEV1/FVC: 74%).    Spirometry consistent with normal pattern.    Allergy Studies: None    Malachi BondsJoel Arshawn Valdez, MD [facility] [place]

## 2016-06-20 ENCOUNTER — Telehealth: Payer: Self-pay | Admitting: Allergy & Immunology

## 2016-06-20 MED ORDER — DEXLANSOPRAZOLE 30 MG PO CPDR: 30.0000 mg | DELAYED_RELEASE_CAPSULE | Freq: Every day | ORAL | 5 refills | Status: DC

## 2016-06-20 NOTE — Telephone Encounter (Signed)
rx for dexilant sent in called patient left message advising of this

## 2016-06-20 NOTE — Telephone Encounter (Signed)
Pt called and needs to have a rx of Dexilant 30mg . Called [address] [facility] [address] [phone]

## 2016-08-03 ENCOUNTER — Encounter (INDEPENDENT_AMBULATORY_CARE_PROVIDER_SITE_OTHER): Payer: Self-pay

## 2016-08-03 ENCOUNTER — Encounter: Payer: Self-pay | Admitting: Neurology

## 2016-08-03 ENCOUNTER — Ambulatory Visit (INDEPENDENT_AMBULATORY_CARE_PROVIDER_SITE_OTHER): Payer: BLUE CROSS/BLUE SHIELD | Admitting: Neurology

## 2016-08-03 VITALS — BP 160/82 | HR 112 | Resp 18 | Ht 70.0 in | Wt 264.0 lb

## 2016-08-03 DIAGNOSIS — J45909 Unspecified asthma, uncomplicated: Secondary | ICD-10-CM

## 2016-08-03 DIAGNOSIS — R0683 Snoring: Secondary | ICD-10-CM | POA: Diagnosis not present

## 2016-08-03 DIAGNOSIS — G43001 Migraine without aura, not intractable, with status migrainosus: Secondary | ICD-10-CM

## 2016-08-03 DIAGNOSIS — J3489 Other specified disorders of nose and nasal sinuses: Secondary | ICD-10-CM

## 2016-08-03 MED ORDER — SUMATRIPTAN 20 MG/ACT NA SOLN: 20.0000 mg | NASAL | Status: DC | PRN

## 2016-08-03 MED ORDER — SUMATRIPTAN 20 MG/ACT NA SOLN: 20.0000 mg | NASAL | 0 refills | Status: DC | PRN

## 2016-08-03 NOTE — Addendum Note (Signed)
Addended by: Melvyn Novas on: 08/03/2016 04:55 PM   Modules accepted: Orders

## 2016-08-03 NOTE — Progress Notes (Signed)
Provider:  Melvyn Novas, M D  Referring Provider: Primary Care Physician:  Cheri Rous, DO   Chief Complaint  Patient presents with  . New Patient (Initial Visit)    Rm 10. Patient states that he has had headaches for the last 2 weeks     HPI:  Jonathan Sims is a 31 y.o. male Patient is seen here as a referral from Dr. Egbert Garibaldi for a headache evaluation.    Jonathan Sims reports that for the first time ever he had a severe migrainous headache about 2 weeks ago . He has had situational HTN and he is obese.  He had had perhaps one other migraine headaches but not to the same intensity or duration. The headaches began suddenly without aura at 1:30 PM and lasted all day until he went to bed at 9 PM. He noted associated nausea, but he was not nauseated to the level where he had to vomit. He had fullness feeling of the ears, the headaches are described as a pressure sensation right above the eyebrows bilateral frontal and did not radiate away from the front of her head. When he woke up the next day which was a Monday he felt sore and achy but did not have the same intensity of pain anymore. He had no visual changes, he did not have any warning signs, but had a dull headache which lingered and remained for the last 18 days, Dr. Salvatore Marvel gave him a 500 mg nonsteroidal anti-inflammatory medication (Naprosyn) on Monday, April 9 and that had taken the edge of the pain but has not alleviated it completely. Sumatriptan was prescribed, did not completely resolve the presumed migraines.  He was started on Topiramate, 50 bid po. Sumatriptan 100 mg prn. The patient also takes medication unrelated to his headaches including Symbicort aerosol, [place], albuterol. When he presented to his primary care physician's office he did have an elevated blood pressure of 145/85 mmHg. Also had a heart rate of 102 bpm indicating that he wasn't stressed. Today he feels that his headaches are better low intensity  but they are not gone still. He also suffers from seasonal allergies that may be a contributing part in this. He does have some nasal congestion at baseline but made a point that currently his nose is much less stopped up but it used to be at this time of the year. He has not had an imaging study of the sinus structures or brain. He has reported headaches are more noticeable when he bends down, which could indicate a sinus headache/  Pressure sensation.    No regular bedtime, since his headaches started he goes to bed at 9 PM and rises at 5 AM to get ready for work . He sleeps on his side, on one pillow only. No nocturia.   Family history of migraines in father , only while he was younger than 66. Grandmother , paternal has frequent Headaches. .   Social history- he drinks  Energy drinks, he plays video games, lives with girlfriend, no children.  No tobacco use, ETOH - 1-2 weekly, caffeine - he drinks sodas, but did stop on topamax.  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    Review of Systems: Out of a complete 14 system review, the patient complains of only the following symptoms, and all other reviewed systems are negative. loud snoring, possible apnea  Headaches with nausea, some photophobia. No aura, frontal headaches and history of sinusitis, allegeric rhinitis.  Obesity.    Social History   Social History  . Marital status: Significant Other    Spouse name: N/A  . Number of children: 0  . Years of education: HS   Occupational History  . [employer]     Social History Main Topics  . Smoking status: Never Smoker  . Smokeless tobacco: Never Used  . Alcohol use Yes     Comment: occ  . Drug use: No  . Sexual activity: Not on file   Other Topics Concern  . Not on file   Social History Narrative   Caffeine: 3-4 drinks a week     Family History  Problem Relation Age of Onset  . Allergic rhinitis  Father     Past Medical History:  Diagnosis Date  . Asthma     Past Surgical History:  Procedure Laterality Date  . Wisdom surgery      Current Outpatient Prescriptions  Medication Sig Dispense Refill  . albuterol (PROVENTIL HFA;VENTOLIN HFA) 108 (90 Base) MCG/ACT inhaler Inhale 1 puff into the lungs every 6 (six) hours as needed for wheezing or shortness of breath.    Marland Kitchen azelastine (ASTELIN) 0.1 % nasal spray Place 1 spray into both nostrils 2 (two) times daily. Use in each nostril as directed 30 mL 12  . budesonide-formoterol (SYMBICORT) 160-4.5 MCG/ACT inhaler Inhale 2 puffs into the lungs 2 (two) times daily. 1 Inhaler 5  . cetirizine (ZYRTEC) 10 MG tablet Take 10 mg by mouth daily.    Marland Kitchen Dexlansoprazole (DEXILANT) 30 MG capsule Take 1 capsule (30 mg total) by mouth daily. 30 capsule 5  . fluticasone (FLONASE) 50 MCG/ACT nasal spray Place 1 spray into both nostrils daily.    . naproxen (NAPROSYN) 500 MG tablet TK 1 T PO BID PRN  0  . topiramate (TOPAMAX) 50 MG tablet TK 1 T PO BID  5  . SUMAtriptan (IMITREX) 100 MG tablet TK 1 T PO AT ONSET OF HA. REPEAT IN 2 HOURS IF NEEDED. NO MORE THAN 2 TS PER 24 HOURS.  0   No current facility-administered medications for this visit.     Allergies as of 08/03/2016  . (No Known Allergies)    Vitals: BP (!) 160/82   Pulse (!) 112   Resp 18   Ht  (1.778 m)   Wt 264 lb (119.7 kg)   BMI 37.88 kg/m  Last Weight:  Wt Readings from Last 1 Encounters:  08/03/16 264 lb (119.7 kg)   Last Height:   Ht Readings from Last 1 Encounters:  08/03/16  (1.778 m)    Physical exam:  General: The patient is awake, alert and appears not in acute distress. The patient is well groomed. Head: Normocephalic, atraumatic. Neck is supple. Mallampati 3 with a swollen , puffy and red uvula.  , neck circumference 17 inches. Crowded dental status. Prognathia .  Cardiovascular:  Regular rate and rhythm, without  murmurs or carotid bruit, and  without distended neck veins. Respiratory: Lungs are clear to auscultation. Skin:  Without evidence of edema, or rash Trunk: Morbidly obese;  BMI is  38 and patient  has normal posture.  Neurologic exam : The patient is awake and alert, oriented to place and time.   Memory subjective  described as intact. There is a normal attention span & concentration ability.  Speech is fluent without dysarthria, dysphonia or aphasia.  Mood and affect are anxious.  Cranial nerves: Pupils are equal and briskly reactive to light. Funduscopic exam without evidence of pallor or edema. Extraocular movements  in vertical and horizontal planes intact and without nystagmus.  Visual fields by finger perimetry are intact. Palpation of the nerves exit points around the orbit did not lead to an exacerbation of head pain and there is no tenderness over the temporal arteries. Hearing to finger rub intact.  Facial sensation intact to fine touch.  Facial motor strength is symmetric and tongue and uvula move midline. Tongue protrusion into either cheek is normal. Shoulder shrug is normal.   Motor exam:   Normal tone,muscle bulk and symmetric  strength in all extremities. Sensory:  Fine touch, pinprick and vibration were tested in all extremities. Proprioception was normal. Coordination: Rapid alternating movements /Finger-to-nose maneuver  normal without evidence of ataxia, dysmetria or tremor. Gait and station: Patient walks without assistive device and is able unassisted to climb up to the exam table. Strength within normal limits. Stance is stable and normal.  Deep tendon reflexes: in the  upper and lower extremities are symmetric and intact. Babinski maneuver response is  downgoing.   Assessment:  After physical and neurologic examination, review of laboratory studies, imaging, neurophysiology testing and pre-existing records, assessment is that of :   Jonathan Sims headaches could be a mixture of sinus headaches and  migraine, but the migraine component is new.  He does have allergic sinusitis exacerbations in spring and autumn, he usually has a congested nose, he is a mouth breather. I noticed his puffy and swollen uvula and wonder if he has untreated GERD or if he may suffer from some sleep apnea as well. I will definitely need an imaging study to make sure that it is not a new location of sinusitis in the frontal sinus area that causes his current headaches.   since this may have been  his first true migraine experience, I do not think we need a preventer at this point and topiramate should be taking at 25 mg nightly or 50 mg nightly but he does not need to take it twice a day at the current dose.He has not felt that it has positively affected HA thus far -except for a taste and sensation change that makes carbonated beverages unpleasant.  (This may help with weight loss!).   I will ask him to reduce or abolish his energy drink consumption, but think he should drink water plane were also go through some dietary restrictions for caffeine, blue cheese, red wine nitrate rich meats such as hot dogs or bacon. All these can be trigger factors for migrainous headaches. The sumatriptan has helped, he has not had complete resolution and is very expensive.   Likely OSA- later on we may do a sleep study.   Plan:  Treatment plan and additional workup :  MRI brain without contrast to rule out sinus disease, mass effect , and evaluate for  vascular headaches, new onset. I will ask the patient to take 50 mg topiramate a day , best at night. No refills at this time.  Sumatriptan- nasal spray , use and go to a quiet , dark room and drink a glass of water. Rest for 30 minutes . Rv with me or NP in 4 weeks.        Porfirio Mylar Obie Kallenbach MD 08/03/2016

## 2016-08-10 ENCOUNTER — Ambulatory Visit (INDEPENDENT_AMBULATORY_CARE_PROVIDER_SITE_OTHER): Payer: BLUE CROSS/BLUE SHIELD

## 2016-08-10 DIAGNOSIS — G43001 Migraine without aura, not intractable, with status migrainosus: Secondary | ICD-10-CM | POA: Diagnosis not present

## 2016-08-10 DIAGNOSIS — J45909 Unspecified asthma, uncomplicated: Secondary | ICD-10-CM

## 2016-08-10 DIAGNOSIS — J3489 Other specified disorders of nose and nasal sinuses: Secondary | ICD-10-CM

## 2016-08-10 DIAGNOSIS — R0683 Snoring: Secondary | ICD-10-CM

## 2016-08-16 ENCOUNTER — Encounter (HOSPITAL_COMMUNITY): Payer: Self-pay | Admitting: Family Medicine

## 2016-08-16 DIAGNOSIS — Y939 Activity, unspecified: Secondary | ICD-10-CM | POA: Diagnosis not present

## 2016-08-16 DIAGNOSIS — S339XXA Sprain of unspecified parts of lumbar spine and pelvis, initial encounter: Secondary | ICD-10-CM | POA: Diagnosis not present

## 2016-08-16 DIAGNOSIS — J45909 Unspecified asthma, uncomplicated: Secondary | ICD-10-CM | POA: Diagnosis not present

## 2016-08-16 DIAGNOSIS — M62830 Muscle spasm of back: Secondary | ICD-10-CM | POA: Insufficient documentation

## 2016-08-16 DIAGNOSIS — Z79899 Other long term (current) drug therapy: Secondary | ICD-10-CM | POA: Diagnosis not present

## 2016-08-16 DIAGNOSIS — S3992XA Unspecified injury of lower back, initial encounter: Secondary | ICD-10-CM | POA: Diagnosis present

## 2016-08-16 DIAGNOSIS — W1830XA Fall on same level, unspecified, initial encounter: Secondary | ICD-10-CM | POA: Insufficient documentation

## 2016-08-16 DIAGNOSIS — Y999 Unspecified external cause status: Secondary | ICD-10-CM | POA: Insufficient documentation

## 2016-08-16 DIAGNOSIS — Y929 Unspecified place or not applicable: Secondary | ICD-10-CM | POA: Insufficient documentation

## 2016-08-16 NOTE — ED Triage Notes (Signed)
Patient reports he fell through the ceiling about an hour ago and landed on his left side. Pt complains of left, mid lower back pain. Pt is unsure if he hit his head but denies LOC. Pt is ambulatory in triaging and standing while triaging. Denies any numbness, tingling, shortness of breath, or chest pain.

## 2016-08-17 ENCOUNTER — Emergency Department (HOSPITAL_COMMUNITY)
Admission: EM | Admit: 2016-08-17 | Discharge: 2016-08-17 | Disposition: A | Discharge status: Home / Self Care | Payer: BLUE CROSS/BLUE SHIELD | Attending: Emergency Medicine | Admitting: Emergency Medicine

## 2016-08-17 ENCOUNTER — Emergency Department (HOSPITAL_COMMUNITY): Payer: BLUE CROSS/BLUE SHIELD

## 2016-08-17 ENCOUNTER — Telehealth: Payer: Self-pay

## 2016-08-17 DIAGNOSIS — S335XXA Sprain of ligaments of lumbar spine, initial encounter: Secondary | ICD-10-CM

## 2016-08-17 DIAGNOSIS — M62830 Muscle spasm of back: Secondary | ICD-10-CM

## 2016-08-17 LAB — URINALYSIS, ROUTINE W REFLEX MICROSCOPIC
BILIRUBIN URINE: NEGATIVE
Glucose, UA: NEGATIVE mg/dL
HGB URINE DIPSTICK: NEGATIVE
Ketones, ur: NEGATIVE mg/dL
Leukocytes, UA: NEGATIVE
Nitrite: NEGATIVE
PROTEIN: NEGATIVE mg/dL
Specific Gravity, Urine: 1.006 (ref 1.005–1.030)
pH: 7 (ref 5.0–8.0)

## 2016-08-17 MED ORDER — HYDROCODONE-ACETAMINOPHEN 5-325 MG PO TABS
2.0000 | ORAL_TABLET | Freq: Once | ORAL | Status: AC
  Administered 2016-08-17: 2 via ORAL
  Filled 2016-08-17: qty 2

## 2016-08-17 MED ORDER — HYDROCODONE-ACETAMINOPHEN 5-325 MG PO TABS: 1.0000 | ORAL_TABLET | Freq: Four times a day (QID) | ORAL | 0 refills | Status: DC | PRN

## 2016-08-17 NOTE — Discharge Instructions (Signed)

## 2016-08-17 NOTE — Telephone Encounter (Signed)
-----   Message from Micki Riley, MD sent at 08/16/2016  8:41 PM EDT ----- Joneen Roach inform patient that MRI brain was normal

## 2016-08-17 NOTE — Telephone Encounter (Signed)
I spoke to patient and he is aware of the results.  

## 2016-08-17 NOTE — ED Provider Notes (Signed)
[facility]-EMERGENCY DEPT Provider Note   CSN: [id number] Arrival date & time: 08/16/16  2333  By signing my name below, I, Bing Neighbors., attest that this documentation has been prepared under the direction and in the presence of Zadie Rhine, MD. Electronically signed: Bing Neighbors., ED Scribe. 08/17/16. 2:40 AM.   History   Chief Complaint Chief Complaint  Patient presents with  . Back Injury    HPI  Jonathan Sims is a 31 y.o. male who presents to the Emergency Department complaining of mild to moderate back pain with sudden onset x4 hours s/p mechanical fall. Pt states that he went to the attic trying to fix a leak and fell through the roof. Upon falling, pt reportedly landed on his back. Pt denies any trouble ambulating. Pt reports L lower back pain, R knee pain, frequency. Pt denies any modifying factors. He denies vomiting, neck pain, chest pain, SOB, abdominal pain, weakness in upper/lower extremities, hematuria. Pt denies any known allergies.   The history is provided by the patient. No language interpreter was used.  Back Pain   This is a new problem. The current episode started 3 to 5 hours ago. The problem occurs constantly. The problem has been gradually worsening. The pain is associated with falling. The pain is present in the lumbar spine. The pain does not radiate. The pain is mild. Exacerbated by: sudden movements. Pertinent negatives include no chest pain, no abdominal pain and no weakness. He has tried nothing for the symptoms.    Past Medical History:  Diagnosis Date  . Asthma     Patient Active Problem List   Diagnosis Date Noted  . Morbid obesity (HCC) 08/03/2016  . Asthma due to seasonal allergies 08/03/2016  . Snoring 08/03/2016  . Chronic nonseasonal allergic rhinitis due to fungal spores 05/05/2016  . Moderate persistent asthma, uncomplicated 05/05/2016    Past Surgical History:  Procedure Laterality Date  . Wisdom surgery          Home Medications    Prior to Admission medications   Medication Sig Start Date End Date Taking? Authorizing Provider  albuterol (PROVENTIL HFA;VENTOLIN HFA) 108 (90 Base) MCG/ACT inhaler Inhale 1 puff into the lungs every 6 (six) hours as needed for wheezing or shortness of breath.    Historical Provider, MD  azelastine (ASTELIN) 0.1 % nasal spray Place 1 spray into both nostrils 2 (two) times daily. Use in each nostril as directed 05/06/16   Alfonse Spruce, MD  budesonide-formoterol [facility]) 160-4.5 MCG/ACT inhaler Inhale 2 puffs into the lungs 2 (two) times daily. 05/16/16   Alfonse Spruce, MD  cetirizine (ZYRTEC) 10 MG tablet Take 10 mg by mouth daily.    Historical Provider, MD  Dexlansoprazole (DEXILANT) 30 MG capsule Take 1 capsule (30 mg total) by mouth daily. 06/20/16   Jessica Priest, MD  fluticasone (FLONASE) 50 MCG/ACT nasal spray Place 1 spray into both nostrils daily.    Historical Provider, MD  naproxen (NAPROSYN) 500 MG tablet TK 1 T PO BID PRN 08/01/16   Historical Provider, MD  SUMAtriptan (IMITREX) 20 MG/ACT nasal spray Place 1 spray (20 mg total) into the nose every 2 (two) hours as needed for migraine or headache. May repeat in 2 hours if headache persists or recurs. 08/03/16   Porfirio Mylar Dohmeier, MD  topiramate (TOPAMAX) 50 MG tablet TK 1 T PO BID 07/21/16   Historical Provider, MD    Family History Family History  Problem Relation Age  of Onset  . Allergic rhinitis Father     Social History Social History  Substance Use Topics  . Smoking status: Never Smoker  . Smokeless tobacco: Never Used  . Alcohol use Yes     Comment: 2-3 times a month     Allergies   Patient has no known allergies.   Review of Systems Review of Systems  Respiratory: Negative for shortness of breath.   Cardiovascular: Negative for chest pain.  Gastrointestinal: Negative for abdominal pain and vomiting.  Genitourinary: Positive for frequency.  Musculoskeletal: Positive  for arthralgias (R knee) and back pain. Negative for neck pain.  Neurological: Negative for weakness.  All other systems reviewed and are negative.    Physical Exam Updated Vital Signs BP (!) 145/98 (BP Location: Left Arm)   Pulse (!) 102   Temp 98.3 F (36.8 C) (Oral)   Resp 20   Ht  (1.778 m)   Wt 263 lb (119.3 kg)   SpO2 100%   BMI 37.74 kg/m   Physical Exam  CONSTITUTIONAL: Well developed/well nourished HEAD: Normocephalic/atraumatic EYES: EOMI/PERRL ENMT: Mucous membranes moist NECK: supple no meningeal signs SPINE/BACK:entire spine nontender, lumbar paraspinal tenderness noted, No bruising/crepitance/stepoffs noted to spine CV: S1/S2 noted, no murmurs/rubs/gallops noted LUNGS: Lungs are clear to auscultation bilaterally, no apparent distress ABDOMEN: soft, nontender, no rebound or guarding, bowel sounds noted throughout abdomen, no bruising GU:no cva tenderness, no bruising noted. NEURO: Pt is awake/alert/appropriate, moves all extremitiesx4.  No facial droop. Equal power in both legs. Pt is ambulatory. EXTREMITIES: pulses normal/equal, full ROM, scattered abrasions noted, All other extremities/joints palpated/ranged and nontender SKIN: warm, color normal PSYCH: no abnormalities of mood noted, alert and oriented to situation  ED Treatments / Results   DIAGNOSTIC STUDIES: Oxygen Saturation is 100% on RA, normal by my interpretation.   COORDINATION OF CARE: 2:40 AM-Discussed next steps with pt. Pt verbalized understanding and is agreeable with the plan.    Labs (all labs ordered are listed, but only abnormal results are displayed) Labs Reviewed  URINALYSIS, ROUTINE W REFLEX MICROSCOPIC - Abnormal; Notable for the following:       Result Value   Color, Urine STRAW (*)    All other components within normal limits    EKG  EKG Interpretation None       Radiology Dg Lumbar Spine Complete  Result Date: 08/17/2016 CLINICAL DATA:  Patient fell  through ceiling and landed on back. Pain. EXAM: LUMBAR SPINE - COMPLETE 4+ VIEW COMPARISON:  None. FINDINGS: There is no evidence of lumbar spine fracture. Normal lumbar segmentation. Slight disc space narrowing at L5-S1. Intact SI joints and arcuate lines of the sacrum. Alignment is normal. Intervertebral disc spaces are maintained. IMPRESSION: No acute fracture identified.  Slight disc space narrowing at L5-S1. Electronically Signed   By: Tollie Eth M.D.   On: 08/17/2016 03:26    Procedures Procedures (including critical care time)  Medications Ordered in ED Medications  HYDROcodone-acetaminophen (NORCO/VICODIN) 5-325 MG per tablet 2 tablet (2 tablets Oral Given 08/17/16 0410)     Initial Impression / Assessment and Plan / ED Course  I have reviewed the triage vital signs and the nursing notes.  Pertinent labs/ imaging results that were available during my care of the patient were reviewed by me and considered in my medical decision making (see chart for details).     Pt presents several hrs after falling through roof No chest or abdominal trauma noted No head injury noted No cervical spine  tenderness He is ambulatory He has no CVAT or bruising I feel he is appropriate for d/c home We discussed strict ER return precautions   Final Clinical Impressions(s) / ED Diagnoses   Final diagnoses:  Back muscle spasm  Lumbar sprain, initial encounter    New Prescriptions New Prescriptions   No medications on file   I personally performed the services described in this documentation, which was scribed in my presence. The recorded information has been reviewed and is accurate.       Zadie Rhine, MD 08/17/16 [phone]

## 2016-10-05 ENCOUNTER — Telehealth: Payer: Self-pay | Admitting: Neurology

## 2016-10-05 NOTE — Telephone Encounter (Signed)
We have attempted to call the patient 3 times to schedule sleep study. Patient has been unavailable at the phone numbers we have on file and has not returned our calls. At this point we will send a letter asking pt to please contact the sleep lab to schedule their sleep study. If patient calls back we will schedule them for their sleep study. ° °

## 2016-11-30 ENCOUNTER — Other Ambulatory Visit: Payer: Self-pay | Admitting: Allergy & Immunology

## 2016-11-30 MED ORDER — DEXLANSOPRAZOLE 30 MG PO CPDR: 30.0000 mg | DELAYED_RELEASE_CAPSULE | Freq: Every day | ORAL | 0 refills | Status: DC

## 2016-11-30 MED ORDER — BUDESONIDE-FORMOTEROL FUMARATE 160-4.5 MCG/ACT IN AERO: 2.0000 | INHALATION_SPRAY | Freq: Two times a day (BID) | RESPIRATORY_TRACT | 0 refills | Status: DC

## 2016-11-30 NOTE — Telephone Encounter (Signed)
Patient needs a refill on medications (did not specify meds) Patient was seen SIX months ago - does not know if he needs another appt or can refills be called in

## 2016-11-30 NOTE — Telephone Encounter (Signed)
Spoke to patient rx sent in  

## 2016-11-30 NOTE — Telephone Encounter (Signed)
Left message to return call in regards to medications

## 2016-12-05 ENCOUNTER — Ambulatory Visit (INDEPENDENT_AMBULATORY_CARE_PROVIDER_SITE_OTHER): Payer: BLUE CROSS/BLUE SHIELD | Admitting: Allergy & Immunology

## 2016-12-05 ENCOUNTER — Encounter: Payer: Self-pay | Admitting: Allergy & Immunology

## 2016-12-05 VITALS — BP 148/78 | HR 80 | Resp 16

## 2016-12-05 DIAGNOSIS — J3089 Other allergic rhinitis: Secondary | ICD-10-CM

## 2016-12-05 DIAGNOSIS — K219 Gastro-esophageal reflux disease without esophagitis: Secondary | ICD-10-CM

## 2016-12-05 DIAGNOSIS — J4540 Moderate persistent asthma, uncomplicated: Secondary | ICD-10-CM

## 2016-12-05 MED ORDER — BUDESONIDE-FORMOTEROL FUMARATE 160-4.5 MCG/ACT IN AERO: 2.0000 | INHALATION_SPRAY | Freq: Two times a day (BID) | RESPIRATORY_TRACT | 5 refills | Status: DC

## 2016-12-05 NOTE — Patient Instructions (Addendum)
1. Moderate persistent asthma, uncomplicated - Lung testing was normal today. - We will not make any medication changes today.  - Daily controller medication(s): Symbicort 160/4.5 two puffs twice daily via spacer - Rescue medications: ProAir 4 puffs every 4-6 hours as needed - Asthma control goals:  * Full participation in all desired activities (may need albuterol before activity) * Albuterol use two time or less a week on average (not counting use with activity) * Cough interfering with sleep two time or less a month * Oral steroids no more than once a year * No hospitalizations  2. Chronic rhinitis (grasses, weeds, trees, molds, cat, dog, dust mite, cockroach) - Continue with Flonase (fluticasone propionate) one spray per nostril daily.  - Continue with Zyrtec (cetirizine) 10mg  once daily.  - Consider allergy shots if there is no improvement.   3. Gastroesophageal reflux disease  - Stop the Dexilant and start omeprazole 20mg  once daily (you can go up to 40mg  daily).  - Restart the Dexilant if this is not working.   4. Return in about 6 months (around 06/07/2017).   Please inform us of any Emergency Department visits, hospitalizations, or changes in symptoms. Call us before going to the ED for breathing or allergy symptoms since we might be able to fit you in for a sick visit. Feel free to contact us anytime with any questions, problems, or concerns.  It was a pleasure to see you again today! Enjoy the rest of your summer!   Websites that have reliable patient information: 1. American Academy of Asthma, Allergy, and Immunology: www.aaaai.org 2. Food Allergy Research and Education (FARE): foodallergy.org 3. Mothers of Asthmatics: http://www.asthmacommunitynetwork.org 4. American College of Allergy, Asthma, and Immunology: www.acaai.org   Election Day is coming up on Tuesday, November 6th! Make your voice heard! Register to vote at vote.org!

## 2016-12-05 NOTE — Progress Notes (Signed)
FOLLOW UP  Date of Service/Encounter:  12/05/16   Assessment:   Moderate persistent asthma, uncomplicated  Chronic nonseasonal allergic rhinitis (grasses, weeds, trees, molds, cat, dog, dust mite, cockroach)  Gastroesophageal reflux disease   Asthma Reportables:  Severity: moderate persistent  Risk: low Control: well controlled   Plan/Recommendations:   1. Moderate persistent asthma, uncomplicated - Lung testing was normal today. - We will not make any medication changes today.  - Daily controller medication(s): Symbicort 160/4.5 two puffs twice daily via spacer - Rescue medications: ProAir 4 puffs every 4-6 hours as needed - Asthma control goals:  * Full participation in all desired activities (may need albuterol before activity) * Albuterol use two time or less a week on average (not counting use with activity) * Cough interfering with sleep two time or less a month * Oral steroids no more than once a year * No hospitalizations  2. Chronic rhinitis (grasses, weeds, trees, molds, cat, dog, dust mite, cockroach) - Continue with Flonase (fluticasone propionate) one spray per nostril daily.  - Continue with Zyrtec (cetirizine) 10mg  once daily.  - Consider allergy shots if there is no improvement.   3. Gastroesophageal reflux disease  - Stop the Dexilant and start omeprazole 20mg  once daily (you can go up to 40mg  daily).  - Restart the Dexilant if this is not working.   4. Return in about 6 months (around 06/07/2017).   Subjective:   Jonathan Sims is a 31 y.o. male presenting today for follow up of  Chief Complaint  Patient presents with  . Asthma    Jonathan Sims has a history of the following: Patient Active Problem List   Diagnosis Date Noted  . Morbid obesity (HCC) 08/03/2016  . Asthma due to seasonal allergies 08/03/2016  . Snoring 08/03/2016  . Chronic nonseasonal allergic rhinitis due to fungal spores 05/05/2016  . Moderate persistent asthma,  uncomplicated 05/05/2016    History obtained from: chart review and patient.  [name] Primary Care Provider is Slatosky, Excell Seltzer., MD.     Jonathan Sims is a 31 y.o. male presenting for a follow up visit. [name] was last seen in February 2018. At that time, his spirometry was normal. We continued him on Symbicort 160/4.52 puffs twice daily as well as pro-air as needed. He has chronic rhinitis with sensitizations to grasses, weeds, trees, molds, cat, dog, dust mite, and cockroach. We continued him on Flonase 1 spray per nostril daily with an antihistamines as needed for breakthrough symptoms. He had abdominal pain which I felt was secondary to reflux and started Dexilant 30 mg once daily. We provided samples initially, and he called back letting us know that it was working well. We sent in a prescription at that point.   Since the last visit, he reports that he has done well. He feels that the Symbicort has provided tremendous relief. In fact he has not needed his rescue at all since starting the Symbicort. Jonathan Sims's asthma has been well controlled. He has not required rescue medication, experienced nocturnal awakenings due to lower respiratory symptoms, nor have activities of daily living been limited. He has required no Emergency Department or Urgent Care visits for his asthma. He has required zero courses of systemic steroids for asthma exacerbations since the last visit. ACT score today is 25, indicating excellent asthma symptom control. He is currently paying $30 per month for his Symbicort.   Allergic rhinitis symptoms well controlled with Flonase daily. He also uses Zyrtec  on a daily basis. He gets both of these name brand from [employer] and is happy with the crisis pain. He has not tried using the generics. He does not feel that he needs the Astelin and is not interested in allergy shots. He has not needed any antibiotics for his sinus infections. His symptoms overall have been much better  controlled since seeing us.   He is otherwise doing well. He did have a 10 foot fall in April 2018 when he was trying to fix a leak in his roof. Amazingly, he was unharmed. He does still see his current primary care provider, but apparently he has not been started on anything for his blood pressure. He remains slightly elevated today.   Otherwise, there have been no changes to his past medical history, surgical history, family history, or social history. 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     Review of Systems: a 14-point review of systems is pertinent for what is mentioned in HPI.  Otherwise, all other systems were negative. Constitutional: negative other than that listed in the HPI Eyes: negative other than that listed in the HPI Ears, nose, mouth, throat, and face: negative other than that listed in the HPI Respiratory: negative other than that listed in the HPI Cardiovascular: negative other than that listed in the HPI Gastrointestinal: negative other than that listed in the HPI Genitourinary: negative other than that listed in the HPI Integument: negative other than that listed in the HPI Hematologic: negative other than that listed in the HPI Musculoskeletal: negative other than that listed in the HPI Neurological: negative other than that listed in the HPI Allergy/Immunologic: negative other than that listed in the HPI    Objective:   Blood pressure (!) 148/78, pulse 80, resp. rate 16. There is no height or weight on file to calculate BMI.   Physical Exam:  General: Alert, interactive, in no acute distress. Pleasant male.  Eyes: No conjunctival injection present on the right, No conjunctival injection present on the left, PERRL bilaterally, No discharge on the right, No discharge on the left and No Horner-Trantas dots present Ears: Right TM pearly gray with normal light reflex, Left TM pearly gray with  normal light reflex, Right TM intact without perforation and Left TM intact without perforation.  Nose/Throat: External nose within normal limits and septum midline, turbinates minimally edematous without discharge, post-pharynx mildly erythematous without cobblestoning in the posterior oropharynx. Tonsils 2+ without exudates Neck: Supple without thyromegaly. Lungs: Clear to auscultation without wheezing, rhonchi or rales. No increased work of breathing. CV: Normal S1/S2, no murmurs. Capillary refill <2 seconds.  Skin: Warm and dry, without lesions or rashes. Neuro:   Grossly intact. No focal deficits appreciated. Responsive to questions.   Diagnostic studies:   Spirometry: results normal (FEV1: 4.18/95%, FVC: 5.58/105%, FEV1/FVC: 74%).    Spirometry consistent with normal pattern.   Allergy Studies: none      Malachi BondsJoel Zaevion Parke, MD [id number]

## 2017-01-23 ENCOUNTER — Telehealth: Payer: Self-pay | Admitting: Allergy & Immunology

## 2017-01-23 NOTE — Telephone Encounter (Signed)
Patient informed that prescription was sent 12-05-16 with 5 additional refills. He will call the pharmacy and request a refill.

## 2017-01-23 NOTE — Telephone Encounter (Signed)
Pt called and said that the Symbicort was not called into [facility] on [address] [phone].

## 2017-08-01 ENCOUNTER — Other Ambulatory Visit: Payer: Self-pay | Admitting: Allergy & Immunology

## 2017-08-01 MED ORDER — BUDESONIDE-FORMOTEROL FUMARATE 160-4.5 MCG/ACT IN AERO: 2.0000 | INHALATION_SPRAY | Freq: Two times a day (BID) | RESPIRATORY_TRACT | 0 refills | Status: DC

## 2017-08-01 NOTE — Telephone Encounter (Signed)
Patient is requesting a refill for Symbicort, [facility] [address]. Last seen 11-2016. I advised he needed and appointment. He made one for April 25.

## 2017-08-17 ENCOUNTER — Ambulatory Visit (INDEPENDENT_AMBULATORY_CARE_PROVIDER_SITE_OTHER): Payer: BLUE CROSS/BLUE SHIELD | Admitting: Allergy & Immunology

## 2017-08-17 ENCOUNTER — Encounter: Payer: Self-pay | Admitting: Allergy & Immunology

## 2017-08-17 VITALS — BP 130/84 | HR 84 | Resp 20

## 2017-08-17 DIAGNOSIS — K219 Gastro-esophageal reflux disease without esophagitis: Secondary | ICD-10-CM | POA: Diagnosis not present

## 2017-08-17 DIAGNOSIS — J4540 Moderate persistent asthma, uncomplicated: Secondary | ICD-10-CM | POA: Diagnosis not present

## 2017-08-17 DIAGNOSIS — J3089 Other allergic rhinitis: Secondary | ICD-10-CM | POA: Diagnosis not present

## 2017-08-17 MED ORDER — ESOMEPRAZOLE MAGNESIUM 40 MG PO CPDR: DELAYED_RELEASE_CAPSULE | ORAL | 5 refills | Status: AC

## 2017-08-17 MED ORDER — OLOPATADINE HCL 0.2 % OP SOLN: 1.0000 [drp] | Freq: Two times a day (BID) | OPHTHALMIC | 5 refills | Status: DC | PRN

## 2017-08-17 MED ORDER — BUDESONIDE-FORMOTEROL FUMARATE 160-4.5 MCG/ACT IN AERO: 2.0000 | INHALATION_SPRAY | Freq: Two times a day (BID) | RESPIRATORY_TRACT | 3 refills | Status: DC

## 2017-08-17 MED ORDER — ALBUTEROL SULFATE HFA 108 (90 BASE) MCG/ACT IN AERS: 1.0000 | INHALATION_SPRAY | Freq: Four times a day (QID) | RESPIRATORY_TRACT | 0 refills | Status: DC | PRN

## 2017-08-17 MED ORDER — AZELASTINE HCL 0.1 % NA SOLN: 1.0000 | Freq: Two times a day (BID) | NASAL | 5 refills | Status: DC

## 2017-08-17 MED ORDER — FLUTICASONE PROPIONATE 50 MCG/ACT NA SUSP: 1.0000 | Freq: Every day | NASAL | 5 refills | Status: AC

## 2017-08-17 NOTE — Progress Notes (Signed)
FOLLOW UP  Date of Service/Encounter:  08/17/17   Assessment:   Moderate persistent asthma, uncomplicated  Perennial and seasonal allergic rhinitis (grasses, weeds, trees, molds, cat, dog, dust mite, cockroach)  Gastroesophageal reflux disease   Asthma Reportables:  Severity: moderate persistent  Risk: low Control: well controlled  Plan/Recommendations:   1. Moderate persistent asthma, uncomplicated - Lung testing looks fantastic today. - We will not make any medication changes today.  - Daily controller medication(s): Symbicort 160/4.5 two puffs twice daily via spacer - Rescue medications: ProAir 4 puffs every 4-6 hours as needed - Asthma control goals:  * Full participation in all desired activities (may need albuterol before activity) * Albuterol use two time or less a week on average (not counting use with activity) * Cough interfering with sleep two time or less a month * Oral steroids no more than once a year * No hospitalizations  2. Chronic rhinitis (grasses, weeds, trees, molds, cat, dog, dust mite, cockroach) - Continue with Flonase (fluticasone propionate) one spray per nostril daily.  - Continue with Zyrtec (cetirizine) 10mg  once daily.  - We will add on Pataday  - Consider allergy shots if there is no improvement.   3. Gastroesophageal reflux disease  - Continue with Nexium 40mg  once daily. - Prescriptions sent in.   4. Return in about 6 months (around 02/16/2018).  Subjective:   Jonathan Sims is a 32 y.o. male presenting today for follow up of  Chief Complaint  Patient presents with  . eosinophilic esophagitis    doing well  . Asthma    no problem    Jonathan Sims has a history of the following: Patient Active Problem List   Diagnosis Date Noted  . Morbid obesity (HCC) 08/03/2016  . Asthma due to seasonal allergies 08/03/2016  . Snoring 08/03/2016  . Chronic nonseasonal allergic rhinitis due to fungal spores 05/05/2016  . Moderate  persistent asthma, uncomplicated 05/05/2016    History obtained from: chart review and patient.  Jonathan Sims Primary Care Provider is Slatosky, Excell Seltzer., MD.     Jonathan Sims is a 32 y.o. male presenting for a follow up visit.  He was last seen in August 2018.  At that time, his lung testing looked excellent.  We continued him on Symbicort 2 puffs twice daily.  He has a history of perennial and seasonal allergic rhinitis with sensitizations to grasses, weeds, trees, molds, cat, dog, dust mite, and cockroach.  We continued with fluticasone 1 spray per nostril daily as well as Zyrtec 10 mg daily.  Reflux, we started omeprazole 20 mg once daily and leave Dexilant.  Since the last visit, he has mostly done well. He went to see GI in October 2018. He was placed on Nexium but never had a scoping performed. The GI provider felt that this was likely EoE, but he never had the biopsies performed to confirm this. Nexium seems to be working fine. Occasionally it will act up but overall it is sgoing much better. He does not feel that his symptoms are related to ingestion of any foods.   Jonathan Sims's asthma has been well controlled. He has not required rescue medication, experienced nocturnal awakenings due to lower respiratory symptoms, nor have activities of daily living been limited. He has required no Emergency Department or Urgent Care visits for his asthma. He has required zero courses of systemic steroids for asthma exacerbations since the last visit. ACT score today is 25, indicating excellent asthma symptom control. He  actually is not sure the last time that he needed his albuterol inhaler.   Jonathan Sims does report itchiness and watering of the left eye. The right one does cause some symptoms but otherwise he doing well with the nasal sprays and the cetirizine. He does have a cat at home which he has tolerated with out a problem. He has had no sinus infections since the last visit, although he may have had one earlier this  year but only self medicated to treat it. He is very happy with how well he is doing considering how everyone else.    Otherwise, there have been no changes to his past medical history, surgical history, family history, or social history.    Review of Systems: a 14-point review of systems is pertinent for what is mentioned in HPI.  Otherwise, all other systems were negative. Constitutional: negative other than that listed in the HPI Eyes: negative other than that listed in the HPI Ears, nose, mouth, throat, and face: negative other than that listed in the HPI Respiratory: negative other than that listed in the HPI Cardiovascular: negative other than that listed in the HPI Gastrointestinal: negative other than that listed in the HPI Genitourinary: negative other than that listed in the HPI Integument: negative other than that listed in the HPI Hematologic: negative other than that listed in the HPI Musculoskeletal: negative other than that listed in the HPI Neurological: negative other than that listed in the HPI Allergy/Immunologic: negative other than that listed in the HPI    Objective:   Blood pressure 130/84, pulse 84, resp. rate 20. There is no height or weight on file to calculate BMI.   Physical Exam:  General: Alert, interactive, in no acute distress. Very pleasant male.   Eyes: No conjunctival injection bilaterally, no discharge on the right, no discharge on the left, no Horner-Trantas dots present and allergic shiners present bilaterally. PERRL bilaterally. EOMI without pain. No photophobia.  Ears: Right TM pearly gray with normal light reflex, Left TM pearly gray with normal light reflex, Right TM intact without perforation and Left TM intact without perforation.  Nose/Throat: External nose within normal limits, nasal crease present and septum midline. Turbinates markedly edematous and pale with clear discharge. Posterior oropharynx markedly erythematous with cobblestoning  in the posterior oropharynx. Tonsils 3+ without exudates.  Tongue without thrush. Lungs: Clear to auscultation without wheezing, rhonchi or rales. No increased work of breathing. CV: Normal S1/S2. No murmurs. Capillary refill <2 seconds.  Skin: Warm and dry, without lesions or rashes. Neuro:   Grossly intact. No focal deficits appreciated. Responsive to questions.  Diagnostic studies:   Spirometry: results normal (FEV1: 3.92/86%, FVC: 5.33/95%, FEV1/FVC: 74%).    Spirometry consistent with normal pattern.  Allergy Studies: none     Malachi BondsJoel Sharryn Belding, MD  [facility]

## 2017-08-17 NOTE — Patient Instructions (Addendum)
1. Moderate persistent asthma, uncomplicated - Lung testing looks fantastic today. - We will not make any medication changes today.  - Daily controller medication(s): Symbicort 160/4.5 two puffs twice daily via spacer - Rescue medications: ProAir 4 puffs every 4-6 hours as needed - Asthma control goals:  * Full participation in all desired activities (may need albuterol before activity) * Albuterol use two time or less a week on average (not counting use with activity) * Cough interfering with sleep two time or less a month * Oral steroids no more than once a year * No hospitalizations  2. Chronic rhinitis (grasses, weeds, trees, molds, cat, dog, dust mite, cockroach) - Continue with Flonase (fluticasone propionate) one spray per nostril daily.  - Continue with Zyrtec (cetirizine) 10mg  once daily.  - We will add on Pataday  - Consider allergy shots if there is no improvement.   3. Gastroesophageal reflux disease  - Continue with Nexium 40mg  once daily. - Prescriptions sent in.   4. Return in about 6 months (around 02/16/2018).   Please inform us of any Emergency Department visits, hospitalizations, or changes in symptoms. Call us before going to the ED for breathing or allergy symptoms since we might be able to fit you in for a sick visit. Feel free to contact us anytime with any questions, problems, or concerns.  It was a pleasure to see you again today!  Websites that have reliable patient information: 1. American Academy of Asthma, Allergy, and Immunology: www.aaaai.org 2. Food Allergy Research and Education (FARE): foodallergy.org 3. Mothers of Asthmatics: http://www.asthmacommunitynetwork.org 4. American College of Allergy, Asthma, and Immunology: www.acaai.org

## 2018-02-26 ENCOUNTER — Other Ambulatory Visit: Payer: Self-pay | Admitting: Physician Assistant

## 2018-02-26 DIAGNOSIS — R1011 Right upper quadrant pain: Secondary | ICD-10-CM

## 2018-03-07 ENCOUNTER — Ambulatory Visit
Admission: RE | Admit: 2018-03-07 | Discharge: 2018-03-07 | Disposition: A | Discharge status: Home / Self Care | Payer: BLUE CROSS/BLUE SHIELD | Source: Ambulatory Visit | Attending: Physician Assistant | Admitting: Physician Assistant

## 2018-03-07 DIAGNOSIS — R1011 Right upper quadrant pain: Secondary | ICD-10-CM

## 2019-05-20 IMAGING — US US ABDOMEN LIMITED
1 series · 13 of 25 positions shown · non-contrast
Comparison: None.

CLINICAL DATA: Upper abdominal pain

EXAM:
ULTRASOUND ABDOMEN LIMITED RIGHT UPPER QUADRANT

[Series 1: us abdomen limited · 0.15mm/px · 13 of 47 slices shown]
[im 1/47]
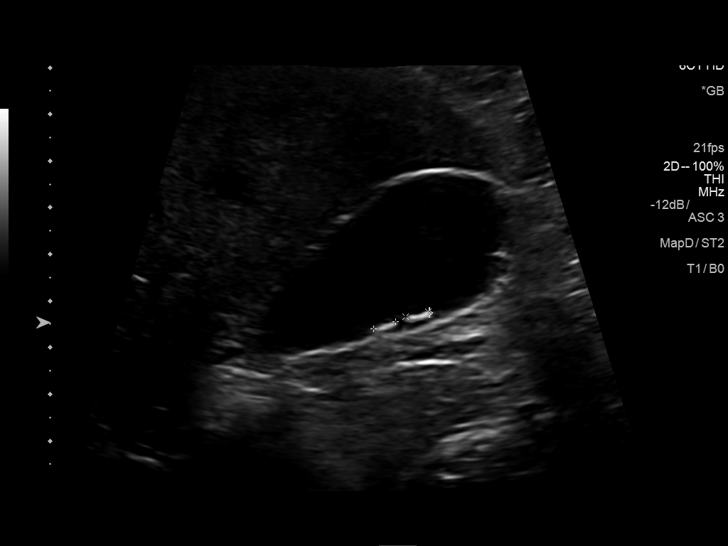
[im 4/47]
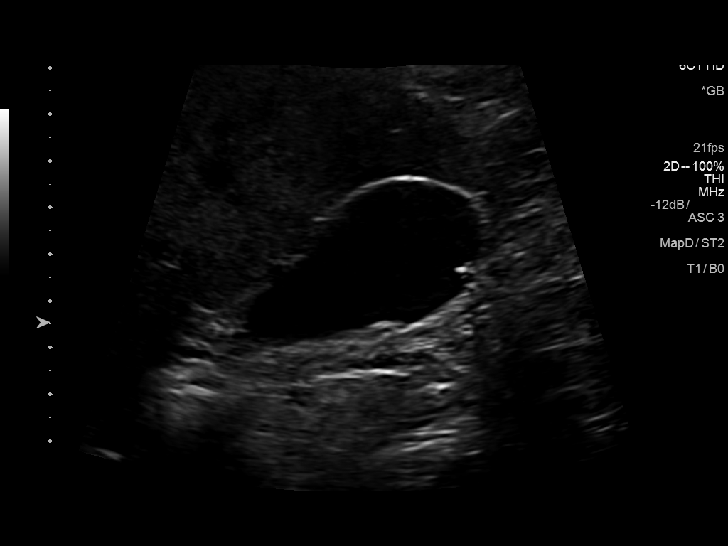
[im 8/47]
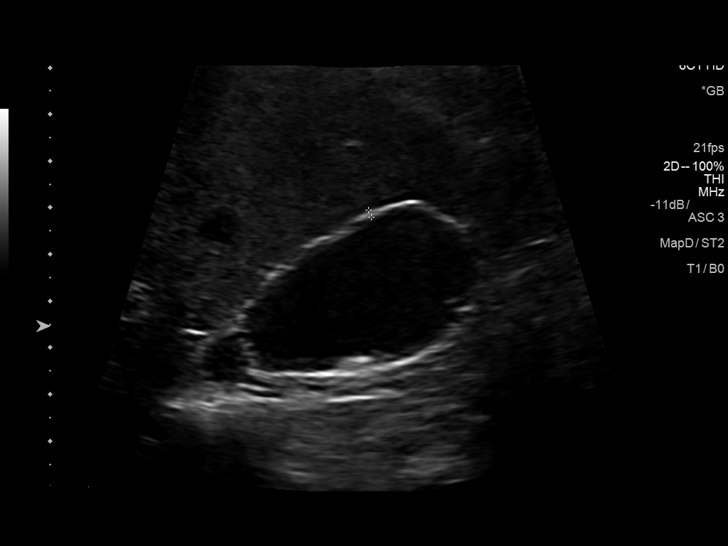
[im 12/47]
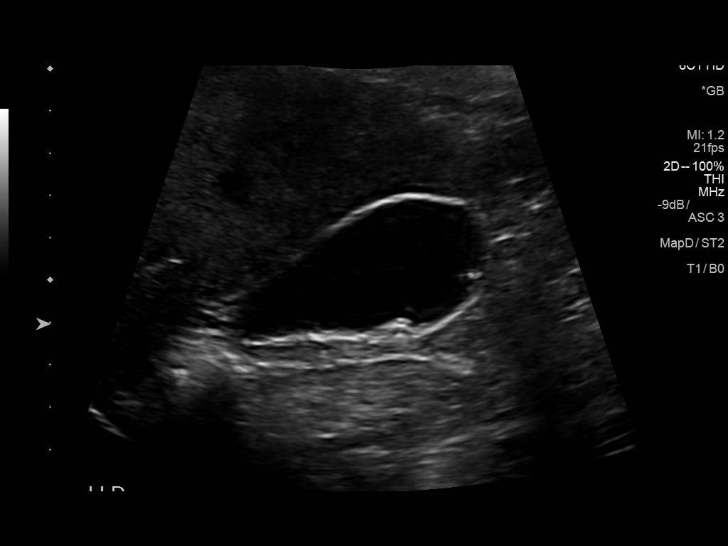
[im 16/47]
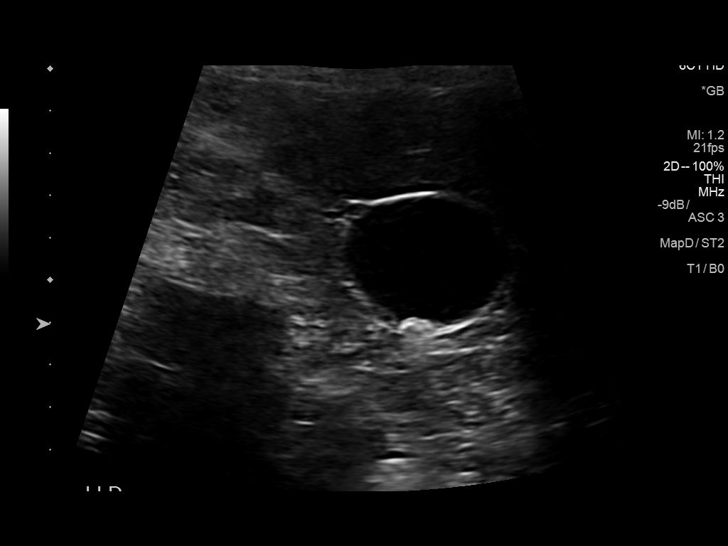
[im 20/47]
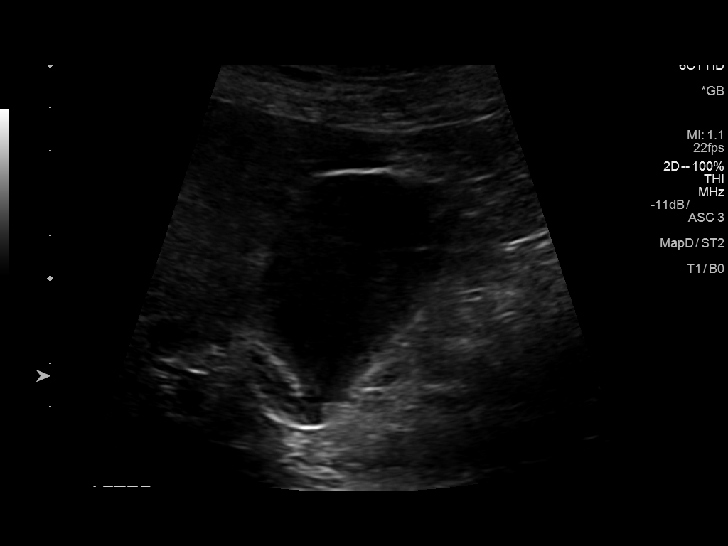
[im 24/47]
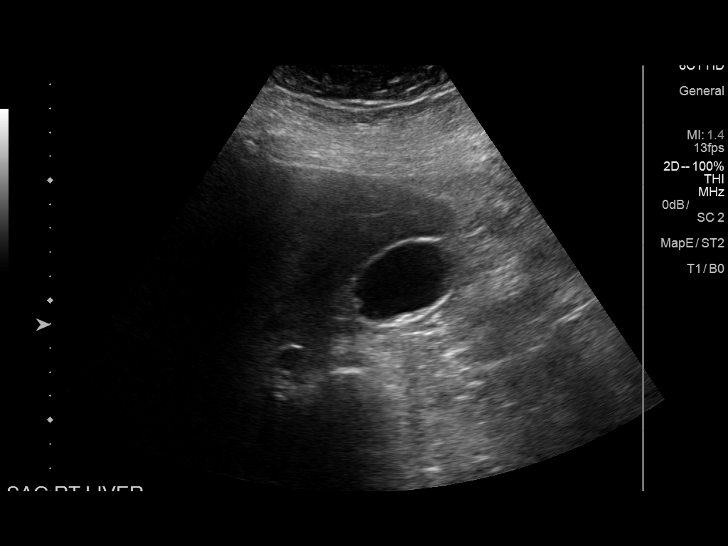
[im 27/47]
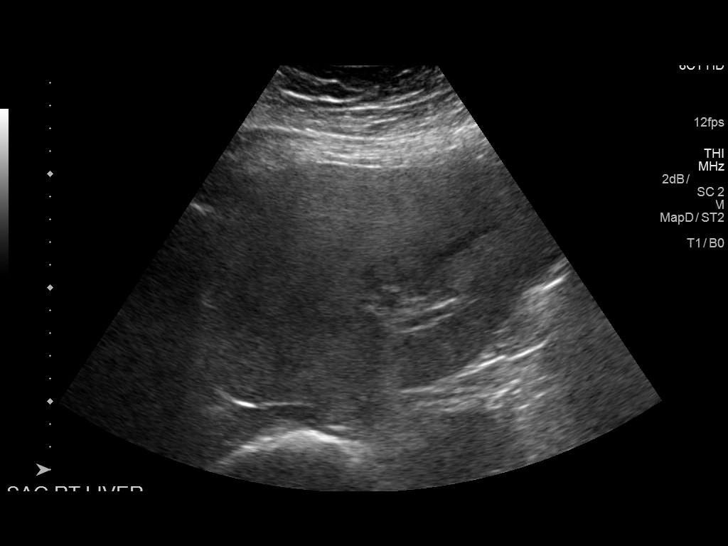
[im 31/47]
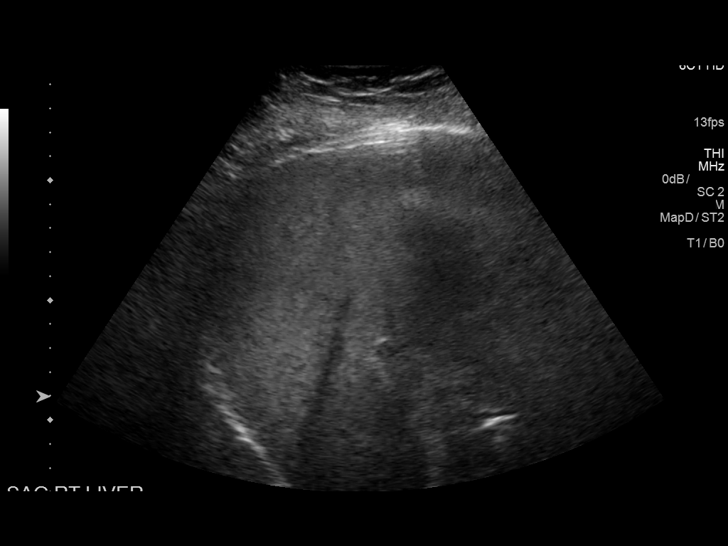
[im 35/47]
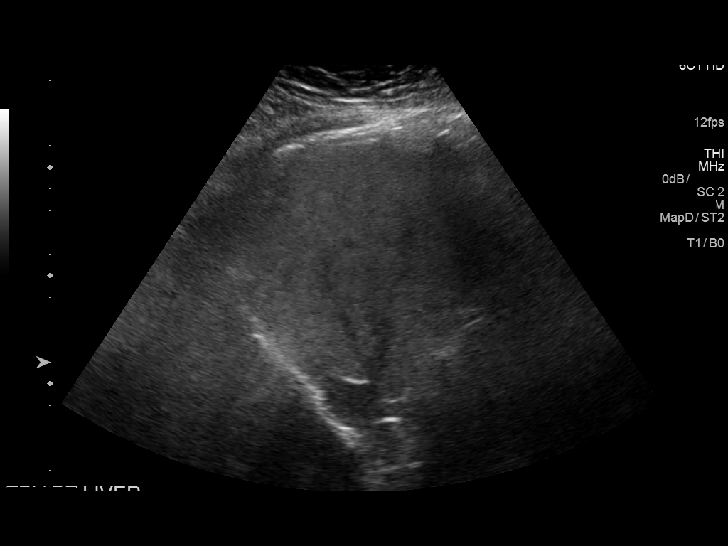
[im 39/47]
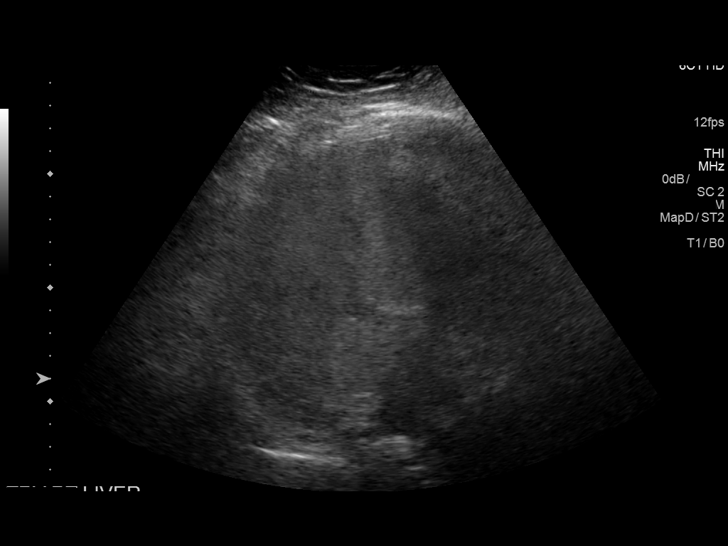
[im 43/47]
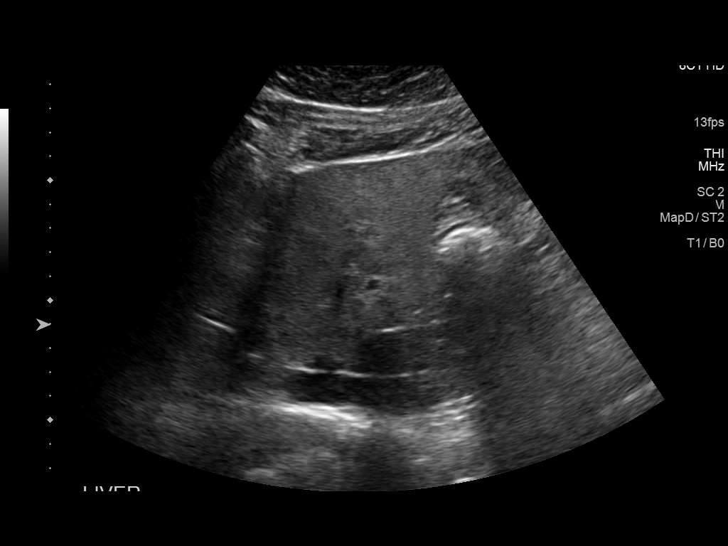
[im 47/47]
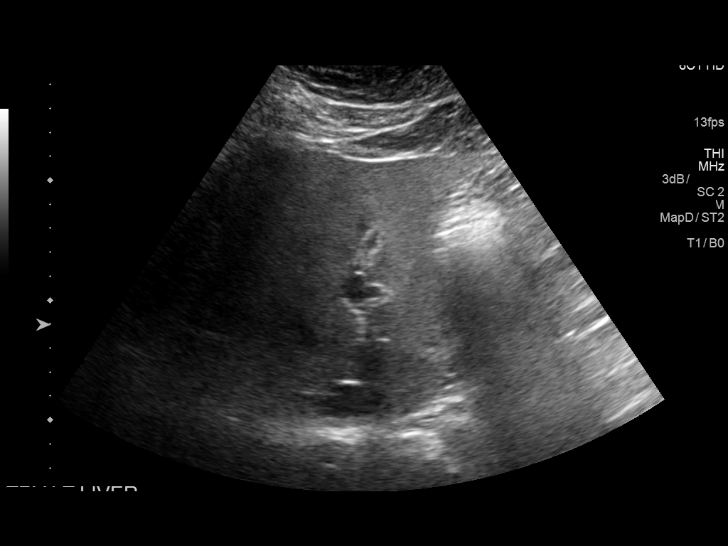

[13 of 25 positions shown; findings below may reference images not displayed]

FINDINGS: Gallbladder:

Within the gallbladder, there are small echogenic foci which move
and shadow consistent with cholelithiasis. There is also a nonmobile
echogenic focus measuring 4 mm, felt to represent a polyp. There is
no gallbladder wall thickening or pericholecystic fluid. No
sonographic Murphy sign noted by sonographer.

Common bile duct:

Diameter: 2 mm. No intrahepatic or extrahepatic biliary duct
dilatation.

Liver:

No focal lesion identified. Liver echogenicity is somewhat
inhomogeneous and overall increased. Portal vein is patent on color
Doppler imaging with normal direction of blood flow towards the
liver.
IMPRESSION: 1. Cholelithiasis. No gallbladder wall thickening or pericholecystic
fluid.

2. 4 mm apparent polyp in the gallbladder. Per consensus guidelines,
a polyp of this small size does not warrant additional imaging
surveillance.

3. The appearance of the liver is suggestive of a degree of
underlying hepatic steatosis/focal fatty infiltration. While no
focal liver lesions are evident on this study, it must be cautioned
that the sensitivity of ultrasound for detection of focal liver
lesions is diminished in this circumstance.

## 2019-07-08 NOTE — Progress Notes (Signed)
[place] [address] [place] Phone: [phone] Subjective:   I Jonathan Sims am serving as a Education administrator for Dr. Hulan Saas.  This visit occurred during the SARS-CoV-2 public health emergency.  Safety protocols were in place, including screening questions prior to the visit, additional usage of staff PPE, and extensive cleaning of exam room while observing appropriate contact time as indicated for disinfecting solutions.   I'm seeing this patient by the request  of:  Enid Skeens., MD  CC: Ankle pain  [id number]  Jonathan Sims is a 34 y.o. male coming in with complaint of right ankle pain. Believes he stepped wrong while playing golf. Last Saturday he ran and the pain worsened. Patient stands all day.  Onset- November Location - achilles  Character- sharp burning pain Aggravating factors- driving, dorsiflexion  Severity-6 out of 10     Past Medical History:  Diagnosis Date  . Asthma    Past Surgical History:  Procedure Laterality Date  . Wisdom surgery     Social History   Socioeconomic History  . Marital status: Significant Other    Spouse name: Not on file  . Number of children: 0  . Years of education: HS  . Highest education level: Not on file  Occupational History  . Occupation: Industrial/product designer   Tobacco Use  . Smoking status: Never Smoker  . Smokeless tobacco: Never Used  Substance and Sexual Activity  . Alcohol use: Yes    Comment: 2-3 times a month  . Drug use: No  . Sexual activity: Not on file  Other Topics Concern  . Not on file  Social History Narrative   Caffeine: 3-4 drinks a week    Social Determinants of Health   Financial Resource Strain:   . Difficulty of Paying Living Expenses:   Food Insecurity:   . Worried About Charity fundraiser in the Last Year:   . Arboriculturist in the Last Year:   Transportation Needs:   . Film/video editor (Medical):   Marland Kitchen Lack of  Transportation (Non-Medical):   Physical Activity:   . Days of Exercise per Week:   . Minutes of Exercise per Session:   Stress:   . Feeling of Stress :   Social Connections:   . Frequency of Communication with Friends and Family:   . Frequency of Social Gatherings with Friends and Family:   . Attends Religious Services:   . Active Member of Clubs or Organizations:   . Attends Archivist Meetings:   Marland Kitchen Marital Status:    No Known Allergies Family History  Problem Relation Age of Onset  . Allergic rhinitis Father       Current Outpatient Medications (Respiratory):  .  albuterol (PROVENTIL HFA;VENTOLIN HFA) 108 (90 Base) MCG/ACT inhaler, Inhale 1 puff into the lungs every 6 (six) hours as needed for wheezing or shortness of breath. Marland Kitchen  azelastine (ASTELIN) 0.1 % nasal spray, Place 1 spray into both nostrils 2 (two) times daily. Use in each nostril as directed .  budesonide-formoterol (SYMBICORT) 160-4.5 MCG/ACT inhaler, Inhale 2 puffs into the lungs 2 (two) times daily. .  cetirizine (ZYRTEC) 10 MG tablet, Take 10 mg by mouth daily. .  fluticasone (FLONASE) 50 MCG/ACT nasal spray, Place 1 spray into both nostrils daily.    Current Outpatient Medications (Other):  .  esomeprazole (NEXIUM) 40 MG capsule, TK 1 C PO QAM .  Olopatadine HCl 0.2 %  SOLN, Apply 1 drop to eye 2 (two) times daily as needed.   Reviewed prior external information including notes and imaging from  primary care provider As well as notes that were available from care everywhere and other healthcare systems.  Past medical history, social, surgical and family history all reviewed in electronic medical record.  No pertanent information unless stated regarding to the chief complaint.   Review of Systems:  No headache, visual changes, nausea, vomiting, diarrhea, constipation, dizziness, abdominal pain, skin rash, fevers, chills, night sweats, weight loss, swollen lymph nodes, body aches, joint swelling,  chest pain, shortness of breath, mood changes. POSITIVE muscle aches  Objective  Blood pressure (!) 146/90, pulse (!) 109, height 5\' 10"  (1.778 m), weight 281 lb (127.5 kg), SpO2 97 %.   General: No apparent distress alert and oriented x3 mood and affect normal, dressed appropriately.  HEENT: Pupils equal, extraocular movements intact  Respiratory: Patient's speak in full sentences and does not appear short of breath  Cardiovascular: No lower extremity edema, non tender, no erythema  Skin: Warm dry intact with no signs of infection or rash on extremities or on axial skeleton.  Abdomen: Soft nontender  Neuro: Cranial nerves II through XII are intact, neurovascularly intact in all extremities with 2+ DTRs and 2+ pulses.  Lymph: No lymphadenopathy of posterior or anterior cervical chain or axillae bilaterally.  Gait normal with good balance and coordination.  MSK:  Non tender with full range of motion and good stability and symmetric strength and tone of shoulders, elbows, wrist, hip, knee bilaterally.  Ankle: Right Patient does have some hagland nodule noted on the lower back of the right ankle.  Tender to palpation in this area.  Does have tightness of the posterior cord of the ankle.  Patient's ankle bone was fairly unremarkable.  Pes planus noted with overpronation of the hindfoot  MSK performed of: Right ankle This study was ordered, performed, and interpreted by [place] D.O.  Foot/Ankle:   Limited musculoskeletal ultrasound of patient's Achilles shows significant increase in Doppler flow with some neovascularization.  Possible intersubstance tearing noted but no significant retraction.  Calf appears to be unremarkable.  No cortical irregularity of the calcaneal region.  IMPRESSION: Severe Achilles tendinitis with potential intrasubstance tearing.  [id number]; 15 additional minutes spent for Therapeutic exercises as stated in above notes.  This included exercises focusing on stretching,  strengthening, with significant focus on eccentric aspects.   Long term goals include an improvement in range of motion, strength, endurance as well as avoiding reinjury. Patient's frequency would include in 1-2 times a day, 3-5 times a week for a duration of 6-12 weeks. Ankle strengthening that included:  Basic range of motion exercises to allow proper full motion at ankle Stretching of the lower leg and hamstrings  Theraband exercises for the lower leg - inversion, eversion, dorsiflexion and plantarflexion each to be completed with a theraband Balance exercises to increase proprioception Weight bearing exercises to increase strength and balance   Proper technique shown and discussed handout in great detail with ATC.  All questions were discussed and answered.      Impression and Recommendations:     This case required medical decision making of moderate complexity. The above documentation has been reviewed and is accurate and complete Terrilee Files, DO       Note: This dictation was prepared with Dragon dictation along with smaller phrase technology. Any transcriptional errors that result from this process are unintentional.

## 2019-07-09 ENCOUNTER — Ambulatory Visit (INDEPENDENT_AMBULATORY_CARE_PROVIDER_SITE_OTHER): Payer: BC Managed Care – PPO

## 2019-07-09 ENCOUNTER — Encounter: Payer: Self-pay | Admitting: Family Medicine

## 2019-07-09 ENCOUNTER — Other Ambulatory Visit: Payer: Self-pay

## 2019-07-09 ENCOUNTER — Ambulatory Visit (INDEPENDENT_AMBULATORY_CARE_PROVIDER_SITE_OTHER): Payer: BC Managed Care – PPO | Admitting: Family Medicine

## 2019-07-09 VITALS — BP 146/90 | HR 109 | Ht 70.0 in | Wt 281.0 lb

## 2019-07-09 DIAGNOSIS — G8929 Other chronic pain: Secondary | ICD-10-CM

## 2019-07-09 DIAGNOSIS — M7661 Achilles tendinitis, right leg: Secondary | ICD-10-CM | POA: Diagnosis not present

## 2019-07-09 DIAGNOSIS — M25571 Pain in right ankle and joints of right foot: Secondary | ICD-10-CM

## 2019-07-09 DIAGNOSIS — M7660 Achilles tendinitis, unspecified leg: Secondary | ICD-10-CM | POA: Insufficient documentation

## 2019-07-09 NOTE — Patient Instructions (Addendum)
Heel lift 1/8th-1/16th of an inch  Exercise 3 times a week Pennsaid small amount up to 2 times a day Spenco orthotics "total support" online would be great  Ice 20 minutes 2 times daily. Usually after activity and before bed.  Consider recovery sandal in the house ([employer]OKA or [employer]OF[employer] are good ones) Biking for cardio if possible  See me again in 4-6 weeks

## 2019-07-10 ENCOUNTER — Encounter: Payer: Self-pay | Admitting: Family Medicine

## 2019-07-10 NOTE — Assessment & Plan Note (Signed)
Severe Achilles tendinitis.  Patient will continue in shoes but we discussed heel lift, topical anti-inflammatories, secondary to history of headaches we will avoid nitroglycerin at this moment.  Discussed range of motion exercises and patient work with Event organiser.  Follow-up again in 4 to 6 weeks likely ultrasound at that time.  If any worsening symptoms will consider cam walker and may be even physical therapy.

## 2019-08-13 ENCOUNTER — Ambulatory Visit: Payer: BC Managed Care – PPO | Admitting: Family Medicine

## 2019-08-22 ENCOUNTER — Ambulatory Visit (INDEPENDENT_AMBULATORY_CARE_PROVIDER_SITE_OTHER): Payer: BC Managed Care – PPO

## 2019-08-22 ENCOUNTER — Other Ambulatory Visit: Payer: Self-pay

## 2019-08-22 ENCOUNTER — Ambulatory Visit (INDEPENDENT_AMBULATORY_CARE_PROVIDER_SITE_OTHER): Payer: BC Managed Care – PPO | Admitting: Family Medicine

## 2019-08-22 ENCOUNTER — Ambulatory Visit: Payer: Self-pay

## 2019-08-22 ENCOUNTER — Encounter: Payer: Self-pay | Admitting: Family Medicine

## 2019-08-22 VITALS — BP 124/86 | HR 91 | Ht 70.0 in | Wt 278.0 lb

## 2019-08-22 DIAGNOSIS — M7661 Achilles tendinitis, right leg: Secondary | ICD-10-CM

## 2019-08-22 DIAGNOSIS — M25571 Pain in right ankle and joints of right foot: Secondary | ICD-10-CM

## 2019-08-22 MED ORDER — VITAMIN D (ERGOCALCIFEROL) 1.25 MG (50000 UNIT) PO CAPS: 50000.0000 [IU] | ORAL_CAPSULE | ORAL | 0 refills | Status: DC

## 2019-08-22 NOTE — Patient Instructions (Signed)
Once weekly Vit D Xray  Brace throughout the day See me in 6 weeks

## 2019-08-22 NOTE — Assessment & Plan Note (Signed)
Seems to be a chronic problem with no significant improvement.  Patient does have less swelling noted on ultrasound but continues to have thin neovascularization.  X-rays of the ankle ordered today.  Patient is given a pneumatic compression brace that I think will be a little bit more beneficial.  Discussed again about shoes and the heel lift as well as potentially compression socks.  Topical anti-inflammatories we also discussed.  Patient will follow up again in 6 weeks.  Worsening symptoms consider the possibility of MRI social determinants of health includes patient is unable to take time off of work secondary to financial constraints

## 2019-08-22 NOTE — Progress Notes (Signed)
[place] Phone: [phone] Subjective:   Jonathan Sims, am serving as a scribe for Dr. Hulan Saas. This visit occurred during the SARS-CoV-2 public health emergency.  Safety protocols were in place, including screening questions prior to the visit, additional usage of staff PPE, and extensive cleaning of exam room while observing appropriate contact time as indicated for disinfecting solutions.   I'm seeing this patient by the request  of:  Enid Skeens., MD  CC: Ankle pain follow-up  [id number]   07/09/2019 Severe Achilles tendinitis.  Patient will continue in shoes but we discussed heel lift, topical anti-inflammatories, secondary to history of headaches we will avoid nitroglycerin at this moment.  Discussed range of motion exercises and patient work with Product/process development scientist.  Follow-up again in 4 to 6 weeks likely ultrasound at that time.  If any worsening symptoms will consider cam walker and may be even physical therapy.  Update 08/22/2019 Jonathan Sims is a 34 y.o. male coming in with complaint of right ankle pain. Patient states that he has been having good and bad days. Drove recently and his foot was painful having to move into inversion and eversion. Has been using heel lift, doing exercises and using Pennsaid.  Patient was states that maybe he is having more good days and bad days.  Always aware of the note.      Past Medical History:  Diagnosis Date  . Asthma    Past Surgical History:  Procedure Laterality Date  . Wisdom surgery     Social History   Socioeconomic History  . Marital status: Significant Other    Spouse name: Not on file  . Number of children: 0  . Years of education: HS  . Highest education level: Not on file  Occupational History  . Occupation: Industrial/product designer   Tobacco Use  . Smoking status: Never Smoker  . Smokeless tobacco: Never Used  Substance and Sexual  Activity  . Alcohol use: Yes    Comment: 2-3 times a month  . Drug use: Sims  . Sexual activity: Not on file  Other Topics Concern  . Not on file  Social History Narrative   Caffeine: 3-4 drinks a week    Social Determinants of Health   Financial Resource Strain:   . Difficulty of Paying Living Expenses:   Food Insecurity:   . Worried About Charity fundraiser in the Last Year:   . Arboriculturist in the Last Year:   Transportation Needs:   . Film/video editor (Medical):   Marland Kitchen Lack of Transportation (Non-Medical):   Physical Activity:   . Days of Exercise per Week:   . Minutes of Exercise per Session:   Stress:   . Feeling of Stress :   Social Connections:   . Frequency of Communication with Friends and Family:   . Frequency of Social Gatherings with Friends and Family:   . Attends Religious Services:   . Active Member of Clubs or Organizations:   . Attends Archivist Meetings:   Marland Kitchen Marital Status:    Sims Known Allergies Family History  Problem Relation Age of Onset  . Allergic rhinitis Father       Current Outpatient Medications (Respiratory):  .  albuterol (PROVENTIL HFA;VENTOLIN HFA) 108 (90 Base) MCG/ACT inhaler, Inhale 1 puff into the lungs every 6 (six) hours as needed for wheezing or shortness of breath. Marland Kitchen  azelastine (  ASTELIN) 0.1 % nasal spray, Place 1 spray into both nostrils 2 (two) times daily. Use in each nostril as directed .  budesonide-formoterol (SYMBICORT) 160-4.5 MCG/ACT inhaler, Inhale 2 puffs into the lungs 2 (two) times daily. .  cetirizine (ZYRTEC) 10 MG tablet, Take 10 mg by mouth daily. .  fluticasone (FLONASE) 50 MCG/ACT nasal spray, Place 1 spray into both nostrils daily.    Current Outpatient Medications (Other):  .  esomeprazole (NEXIUM) 40 MG capsule, TK 1 C PO QAM .  Olopatadine HCl 0.2 % SOLN, Apply 1 drop to eye 2 (two) times daily as needed.   Reviewed prior external information including notes and imaging from   primary care provider As well as notes that were available from care everywhere and other healthcare systems.  Past medical history, social, surgical and family history all reviewed in electronic medical record.  Sims pertanent information unless stated regarding to the chief complaint.   Review of Systems:  Sims headache, visual changes, nausea, vomiting, diarrhea, constipation, dizziness, abdominal pain, skin rash, fevers, chills, night sweats, weight loss, swollen lymph nodes, body aches, joint swelling, chest pain, shortness of breath, mood changes. POSITIVE muscle aches  Objective  There were Sims vitals taken for this visit.   General: Sims apparent distress alert and oriented x3 mood and affect normal, dressed appropriately.  HEENT: Pupils equal, extraocular movements intact  Respiratory: Patient's speak in full sentences and does not appear short of breath  Cardiovascular: Sims lower extremity edema, non tender, Sims erythema  Neuro: Cranial nerves II through XII are intact, neurovascularly intact in all extremities with 2+ DTRs and 2+ pulses.  Gait normal with good balance and coordination.  MSK:  Non tender with full range of motion and good stability and symmetric strength and tone of shoulders, elbows, wrist, hip, knee bilaterally.  Ankle: Right Sims visible erythema or swelling.  Very small tender nodule noted.  Tender to palpation.  None.  Neurovascular intact distally.  Good range of motion of the ankle noted.  Some mild tightness with dorsiflexion is noted.   MSK US performed of: Right ankle This study was ordered, performed, and interpreted by Terrilee Files D.O.  Foot/Ankle:   Limited ankle ultrasound of the Achilles shows patient still has increasing neovascularization.  Possible intersubstance tear and still noted the significant decrease in the hypoechoic changes.  IMPRESSION: Severe Achilles tendinitis.    Impression and Recommendations:     This case required medical decision  making of moderate complexity. The above documentation has been reviewed and is accurate and complete Judi Saa, DO       Note: This dictation was prepared with Dragon dictation along with smaller phrase technology. Any transcriptional errors that result from this process are unintentional.

## 2019-09-03 ENCOUNTER — Ambulatory Visit: Payer: BC Managed Care – PPO | Admitting: Family Medicine

## 2019-10-03 ENCOUNTER — Encounter: Payer: Self-pay | Admitting: Family Medicine

## 2019-10-03 ENCOUNTER — Other Ambulatory Visit: Payer: Self-pay

## 2019-10-03 ENCOUNTER — Ambulatory Visit: Payer: Self-pay

## 2019-10-03 ENCOUNTER — Ambulatory Visit (INDEPENDENT_AMBULATORY_CARE_PROVIDER_SITE_OTHER): Payer: BC Managed Care – PPO | Admitting: Family Medicine

## 2019-10-03 VITALS — BP 140/90 | HR 109 | Ht 70.0 in | Wt 278.0 lb

## 2019-10-03 DIAGNOSIS — M7661 Achilles tendinitis, right leg: Secondary | ICD-10-CM | POA: Diagnosis not present

## 2019-10-03 DIAGNOSIS — G8929 Other chronic pain: Secondary | ICD-10-CM | POA: Diagnosis not present

## 2019-10-03 DIAGNOSIS — M25571 Pain in right ankle and joints of right foot: Secondary | ICD-10-CM

## 2019-10-03 NOTE — Patient Instructions (Signed)
After 4th of July 1 min jog 1 min walk for a total of 20 mins 3 times a week Keep wearing the brace with work Designer, industrial/product and [employer] work shoes on [employer] See me again in 2 months

## 2019-10-03 NOTE — Progress Notes (Signed)
Jonathan Sims [address] [place] Phone: [phone] Subjective:   I Jonathan Sims am serving as a Education administrator for Dr. Hulan Saas.  This visit occurred during the SARS-CoV-2 public health emergency.  Safety protocols were in place, including screening questions prior to the visit, additional usage of staff PPE, and extensive cleaning of exam room while observing appropriate contact time as indicated for disinfecting solutions.   I'm seeing this patient by the request  of:  Jonathan Skeens., MD  CC: Right ankle pain follow-up  [id number]   08/22/2019 Seems to be a chronic problem with no significant improvement.  Patient does have less swelling noted on ultrasound but continues to have thin neovascularization.  X-rays of the ankle ordered today.  Patient is given a pneumatic compression brace that I think will be a little bit more beneficial.  Discussed again about shoes and the heel lift as well as potentially compression socks.  Topical anti-inflammatories we also discussed.  Patient will follow up again in 6 weeks.  Worsening symptoms consider the possibility of MRI social determinants of health includes patient is unable to take time off of work secondary to financial constraints  Update 10/03/2019 Jonathan Sims is a 34 y.o. male coming in with complaint of right achilles tendonitis. Patient states that this week the achilles is a lot better.  Would state 85 to 90% better.  Still some mild stiffness after sitting a long amount of time.  Continues to work with patient having to stand for long time.  Feels that that still contributes.  Looking to get new shoes at work which he thinks will be beneficial.  Has been wearing the pneumatic brace that we have given him fairly regularly     Past Medical History:  Diagnosis Date  . Asthma    Past Surgical History:  Procedure Laterality Date  . Wisdom surgery     Social History   Socioeconomic  History  . Marital status: Significant Other    Spouse name: Not on file  . Number of children: 0  . Years of education: HS  . Highest education level: Not on file  Occupational History  . Occupation: Industrial/product designer   Tobacco Use  . Smoking status: Never Smoker  . Smokeless tobacco: Never Used  Vaping Use  . Vaping Use: Never used  Substance and Sexual Activity  . Alcohol use: Yes    Comment: 2-3 times a month  . Drug use: No  . Sexual activity: Not on file  Other Topics Concern  . Not on file  Social History Narrative   Caffeine: 3-4 drinks a week    Social Determinants of Health   Financial Resource Strain:   . Difficulty of Paying Living Expenses:   Food Insecurity:   . Worried About Charity fundraiser in the Last Year:   . Arboriculturist in the Last Year:   Transportation Needs:   . Film/video editor (Medical):   Jonathan Sims Lack of Transportation (Non-Medical):   Physical Activity:   . Days of Exercise per Week:   . Minutes of Exercise per Session:   Stress:   . Feeling of Stress :   Social Connections:   . Frequency of Communication with Friends and Family:   . Frequency of Social Gatherings with Friends and Family:   . Attends Religious Services:   . Active Member of Clubs or Organizations:   . Attends Archivist  Meetings:   Jonathan Sims Marital Status:    No Known Allergies Family History  Problem Relation Age of Onset  . Allergic rhinitis Father       Current Outpatient Medications (Respiratory):  .  albuterol (PROVENTIL HFA;VENTOLIN HFA) 108 (90 Base) MCG/ACT inhaler, Inhale 1 puff into the lungs every 6 (six) hours as needed for wheezing or shortness of breath. Jonathan Sims  azelastine (ASTELIN) 0.1 % nasal spray, Place 1 spray into both nostrils 2 (two) times daily. Use in each nostril as directed .  budesonide-formoterol (SYMBICORT) 160-4.5 MCG/ACT inhaler, Inhale 2 puffs into the lungs 2 (two) times daily. .  cetirizine (ZYRTEC) 10 MG tablet, Take 10 mg  by mouth daily. .  fluticasone (FLONASE) 50 MCG/ACT nasal spray, Place 1 spray into both nostrils daily.    Current Outpatient Medications (Other):  .  esomeprazole (NEXIUM) 40 MG capsule, TK 1 C PO QAM .  Olopatadine HCl 0.2 % SOLN, Apply 1 drop to eye 2 (two) times daily as needed. .  Vitamin D, Ergocalciferol, (DRISDOL) 1.25 MG (50000 UNIT) CAPS capsule, Take 1 capsule (50,000 Units total) by mouth every 7 (seven) days.   Reviewed prior external information including notes and imaging from  primary care provider As well as notes that were available from care everywhere and other healthcare systems.  Past medical history, social, surgical and family history all reviewed in electronic medical record.  No pertanent information unless stated regarding to the chief complaint.   Review of Systems:  No headache, visual changes, nausea, vomiting, diarrhea, constipation, dizziness, abdominal pain, skin rash, fevers, chills, night sweats, weight loss, swollen lymph nodes, , joint swelling, chest pain, shortness of breath, mood changes. POSITIVE muscle aches, body aches  Objective  Blood pressure 140/90, pulse (!) 109, height 5\' 10"  (1.778 m), weight 278 lb (126.1 kg), SpO2 97 %.   General: No apparent distress alert and oriented x3 mood and affect normal, dressed appropriately.  HEENT: Pupils equal, extraocular movements intact  Respiratory: Patient's speak in full sentences and does not appear short of breath  Cardiovascular: No lower extremity edema, non tender, no erythema  Neuro: Cranial nerves II through XII are intact, neurovascularly intact in all extremities with 2+ DTRs and 2+ pulses.  Gait antalgic MSK: Right ankle still shows the patient has significant pes planus.  Mild tightness of the Achilles tendon noted.  Neurovascular intact distally.  5 out of 5 strength of the lower extremity.  No instability of the ankle noted. Limited musculoskeletal ultrasound was performed and  interpreted by  Limited ultrasound the patient's Achilles tendon shows some mild calcific changes but very minimal to no hypoechoic changes.  No true tearing noted.  No increase in Doppler flow at this time   Impression and Recommendations:     The above documentation has been reviewed and is accurate and complete Judi Saa, DO       Note: This dictation was prepared with Dragon dictation along with smaller phrase technology. Any transcriptional errors that result from this process are unintentional.

## 2019-10-04 ENCOUNTER — Encounter: Payer: Self-pay | Admitting: Family Medicine

## 2019-10-04 NOTE — Assessment & Plan Note (Signed)
Significant improvement at this time.  Very mild calcific changes still noted.  Encouraged to vitamin D still, encouraged heel lift, home exercises, follow-up again 4 to 6 weeks

## 2019-12-03 ENCOUNTER — Ambulatory Visit (INDEPENDENT_AMBULATORY_CARE_PROVIDER_SITE_OTHER): Payer: BC Managed Care – PPO | Admitting: Family Medicine

## 2019-12-03 ENCOUNTER — Other Ambulatory Visit: Payer: Self-pay

## 2019-12-03 ENCOUNTER — Ambulatory Visit: Payer: Self-pay

## 2019-12-03 VITALS — BP 128/88 | HR 97 | Ht 70.0 in | Wt 281.0 lb

## 2019-12-03 DIAGNOSIS — M7661 Achilles tendinitis, right leg: Secondary | ICD-10-CM | POA: Diagnosis not present

## 2019-12-03 DIAGNOSIS — G8929 Other chronic pain: Secondary | ICD-10-CM | POA: Diagnosis not present

## 2019-12-03 DIAGNOSIS — M25571 Pain in right ankle and joints of right foot: Secondary | ICD-10-CM

## 2019-12-03 NOTE — Patient Instructions (Signed)
See me in 3 months just in case [employer] for new shoes Try heel lift at [facility] If doing well see me when you need me

## 2019-12-03 NOTE — Progress Notes (Signed)
[name] [address] [place] [address] Phone: [phone] Subjective:   Jonathan Sims, am serving as a scribe for Dr. Antoine Primas. This visit occurred during the SARS-CoV-2 public health emergency.  Safety protocols were in place, including screening questions prior to the visit, additional usage of staff PPE, and extensive cleaning of exam room while observing appropriate contact time as indicated for disinfecting solutions.   I'm seeing this patient by the request  of:  Nonnie Done., MD  CC: Ankle pain follow-up  ALP:[id number]   10/03/2019 Significant improvement at this time.  Very mild calcific changes still noted.  Encouraged to vitamin D still, encouraged heel lift, home exercises, follow-up again 4 to 6 weeks  Update 12/03/2019 Jonathan Sims is a 34 y.o. male coming in with complaint of right achilles pain. Patient states that his pain has improved. Bothers him once a week. Soreness after being on his feet all day. Wearing brace at work which helps to alleviate his pain.      Past Medical History:  Diagnosis Date  . Asthma    Past Surgical History:  Procedure Laterality Date  . Wisdom surgery     Social History   Socioeconomic History  . Marital status: Significant Other    Spouse name: Not on file  . Number of children: 0  . Years of education: HS  . Highest education level: Not on file  Occupational History  . Occupation: Administrator, Civil Service   Tobacco Use  . Smoking status: Never Smoker  . Smokeless tobacco: Never Used  Vaping Use  . Vaping Use: Never used  Substance and Sexual Activity  . Alcohol use: Yes    Comment: 2-3 times a month  . Drug use: No  . Sexual activity: Not on file  Other Topics Concern  . Not on file  Social History Narrative   Caffeine: 3-4 drinks a week    Social Determinants of Health   Financial Resource Strain:   . Difficulty of Paying Living Expenses:   Food Insecurity:     . Worried About Programme researcher, broadcasting/film/video in the Last Year:   . Barista in the Last Year:   Transportation Needs:   . Freight forwarder (Medical):   Marland Kitchen Lack of Transportation (Non-Medical):   Physical Activity:   . Days of Exercise per Week:   . Minutes of Exercise per Session:   Stress:   . Feeling of Stress :   Social Connections:   . Frequency of Communication with Friends and Family:   . Frequency of Social Gatherings with Friends and Family:   . Attends Religious Services:   . Active Member of Clubs or Organizations:   . Attends Banker Meetings:   Marland Kitchen Marital Status:    No Known Allergies Family History  Problem Relation Age of Onset  . Allergic rhinitis Father       Current Outpatient Medications (Respiratory):  .  albuterol (PROVENTIL HFA;VENTOLIN HFA) 108 (90 Base) MCG/ACT inhaler, Inhale 1 puff into the lungs every 6 (six) hours as needed for wheezing or shortness of breath. Marland Kitchen  azelastine (ASTELIN) 0.1 % nasal spray, Place 1 spray into both nostrils 2 (two) times daily. Use in each nostril as directed .  budesonide-formoterol (SYMBICORT) 160-4.5 MCG/ACT inhaler, Inhale 2 puffs into the lungs 2 (two) times daily. .  cetirizine (ZYRTEC) 10 MG tablet, Take 10 mg by mouth daily. .  fluticasone (FLONASE)  50 MCG/ACT nasal spray, Place 1 spray into both nostrils daily.    Current Outpatient Medications (Other):  .  esomeprazole (NEXIUM) 40 MG capsule, TK 1 C PO QAM .  Olopatadine HCl 0.2 % SOLN, Apply 1 drop to eye 2 (two) times daily as needed. .  Vitamin D, Ergocalciferol, (DRISDOL) 1.25 MG (50000 UNIT) CAPS capsule, Take 1 capsule (50,000 Units total) by mouth every 7 (seven) days.   Reviewed prior external information including notes and imaging from  primary care provider As well as notes that were available from care everywhere and other healthcare systems.  Past medical history, social, surgical and family history all reviewed in electronic  medical record.  No pertanent information unless stated regarding to the chief complaint.   Review of Systems:  No headache, visual changes, nausea, vomiting, diarrhea, constipation, dizziness, abdominal pain, skin rash, fevers, chills, night sweats, weight loss, swollen lymph nodes, body aches, joint swelling, chest pain, shortness of breath, mood changes. POSITIVE muscle aches  Objective  Blood pressure 128/88, pulse 97, height 5\' 10"  (1.778 m), weight 281 lb (127.5 kg), SpO2 97 %.   General: No apparent distress alert and oriented x3 mood and affect normal, dressed appropriately.  HEENT: Pupils equal, extraocular movements intact  Respiratory: Patient's speak in full sentences and does not appear short of breath  Cardiovascular: No lower extremity edema, non tender, no erythema  Neuro: Cranial nerves II through XII are intact, neurovascularly intact in all extremities with 2+ DTRs and 2+ pulses.  Gait normal with good balance and coordination.  MSK:  Non tender with full range of motion and good stability and symmetric strength and tone of shoulders, elbows, wrist, hip, knee bilaterally.  Full extension the patient does have pes planus noted.  Mild tightness of the posterior capsule noted on the right compared to left.  Patient is significantly less tender and less inflammation at the insertion of the Achilles from previous exams.  Possibly even a decrease in size of the Haglund nodule.  Neurovascularly intact distally with 5 out of 5 strength  Limited musculoskeletal ultrasound was performed and interpreted by  Limited ultrasound of patient's Achilles shows significant decrease in calcific changes and hypoechoic changes.  Very mild increase in Doppler flow right at the insertion.  Otherwise fairly unremarkable. Impression: Near complete resolution of Achilles tendinitis   Impression and Recommendations:     The above documentation has been reviewed and is accurate and  complete Jonathan Saa, DO       Note: This dictation was prepared with Dragon dictation along with smaller phrase technology. Any transcriptional errors that result from this process are unintentional.

## 2019-12-04 ENCOUNTER — Encounter: Payer: Self-pay | Admitting: Family Medicine

## 2019-12-04 NOTE — Assessment & Plan Note (Signed)
Patient is doing remarkably well at this time.  Given heel lift due to patient's knee brace seemed to break at this time.  We discussed proper shoes and possibly rocker-bottom shoes been beneficial different websites very good findings.  Icing regimen, home exercises, as long as patient does well can follow-up as needed

## 2020-03-04 NOTE — Progress Notes (Deleted)
[name] [address] [place] [address] Phone: [phone] Subjective:    I'm seeing this patient by the request  of:  Nonnie Done., MD  CC:   [id number]   12/03/2019 Patient is doing remarkably well at this time.  Given heel lift due to patient's knee brace seemed to break at this time.  We discussed proper shoes and possibly rocker-bottom shoes been beneficial different websites very good findings.  Icing regimen, home exercises, as long as patient does well can follow-up as needed  Update 03/05/2020 Jonathan Sims is a 34 y.o. male coming in with complaint of right achilles tendonitis. Patient states       Past Medical History:  Diagnosis Date  . Asthma    Past Surgical History:  Procedure Laterality Date  . Wisdom surgery     Social History   Socioeconomic History  . Marital status: Significant Other    Spouse name: Not on file  . Number of children: 0  . Years of education: HS  . Highest education level: Not on file  Occupational History  . Occupation: Administrator, Civil Service   Tobacco Use  . Smoking status: Never Smoker  . Smokeless tobacco: Never Used  Vaping Use  . Vaping Use: Never used  Substance and Sexual Activity  . Alcohol use: Yes    Comment: 2-3 times a month  . Drug use: No  . Sexual activity: Not on file  Other Topics Concern  . Not on file  Social History Narrative   Caffeine: 3-4 drinks a week    Social Determinants of Health   Financial Resource Strain:   . Difficulty of Paying Living Expenses: Not on file  Food Insecurity:   . Worried About Programme researcher, broadcasting/film/video in the Last Year: Not on file  . Ran Out of Food in the Last Year: Not on file  Transportation Needs:   . Lack of Transportation (Medical): Not on file  . Lack of Transportation (Non-Medical): Not on file  Physical Activity:   . Days of Exercise per Week: Not on file  . Minutes of Exercise per Session: Not on file  Stress:   .  Feeling of Stress : Not on file  Social Connections:   . Frequency of Communication with Friends and Family: Not on file  . Frequency of Social Gatherings with Friends and Family: Not on file  . Attends Religious Services: Not on file  . Active Member of Clubs or Organizations: Not on file  . Attends Banker Meetings: Not on file  . Marital Status: Not on file   No Known Allergies Family History  Problem Relation Age of Onset  . Allergic rhinitis Father       Current Outpatient Medications (Respiratory):  .  albuterol (PROVENTIL HFA;VENTOLIN HFA) 108 (90 Base) MCG/ACT inhaler, Inhale 1 puff into the lungs every 6 (six) hours as needed for wheezing or shortness of breath. Marland Kitchen  azelastine (ASTELIN) 0.1 % nasal spray, Place 1 spray into both nostrils 2 (two) times daily. Use in each nostril as directed .  budesonide-formoterol (SYMBICORT) 160-4.5 MCG/ACT inhaler, Inhale 2 puffs into the lungs 2 (two) times daily. .  cetirizine (ZYRTEC) 10 MG tablet, Take 10 mg by mouth daily. .  fluticasone (FLONASE) 50 MCG/ACT nasal spray, Place 1 spray into both nostrils daily.    Current Outpatient Medications (Other):  .  esomeprazole (NEXIUM) 40 MG capsule, TK 1 C PO QAM .  Olopatadine HCl 0.2 % SOLN, Apply 1 drop to eye 2 (two) times daily as needed. .  Vitamin D, Ergocalciferol, (DRISDOL) 1.25 MG (50000 UNIT) CAPS capsule, Take 1 capsule (50,000 Units total) by mouth every 7 (seven) days.   Reviewed prior external information including notes and imaging from  primary care provider As well as notes that were available from care everywhere and other healthcare systems.  Past medical history, social, surgical and family history all reviewed in electronic medical record.  No pertanent information unless stated regarding to the chief complaint.   Review of Systems:  No headache, visual changes, nausea, vomiting, diarrhea, constipation, dizziness, abdominal pain, skin rash, fevers,  chills, night sweats, weight loss, swollen lymph nodes, body aches, joint swelling, chest pain, shortness of breath, mood changes. POSITIVE muscle aches  Objective  There were no vitals taken for this visit.   General: No apparent distress alert and oriented x3 mood and affect normal, dressed appropriately.  HEENT: Pupils equal, extraocular movements intact  Respiratory: Patient's speak in full sentences and does not appear short of breath  Cardiovascular: No lower extremity edema, non tender, no erythema  Neuro: Cranial nerves II through XII are intact, neurovascularly intact in all extremities with 2+ DTRs and 2+ pulses.  Gait normal with good balance and coordination.  MSK:  Non tender with full range of motion and good stability and symmetric strength and tone of shoulders, elbows, wrist, hip, knee and ankles bilaterally.     Impression and Recommendations:     The above documentation has been reviewed and is accurate and complete Jonathan Sims

## 2020-03-05 ENCOUNTER — Ambulatory Visit: Payer: BC Managed Care – PPO | Admitting: Family Medicine

## 2020-12-03 DIAGNOSIS — U071 COVID-19: Secondary | ICD-10-CM | POA: Diagnosis not present

## 2021-02-22 DIAGNOSIS — J4530 Mild persistent asthma, uncomplicated: Secondary | ICD-10-CM | POA: Diagnosis not present

## 2021-07-29 IMAGING — DX DG ANKLE 2V *R*
2 series · 2 of 2 positions shown · non-contrast
Comparison: None.

CLINICAL DATA: Injury several months ago with persistent right
ankle pain, initial encounter

EXAM:
RIGHT ANKLE - 2 VIEW

[ankle ap]
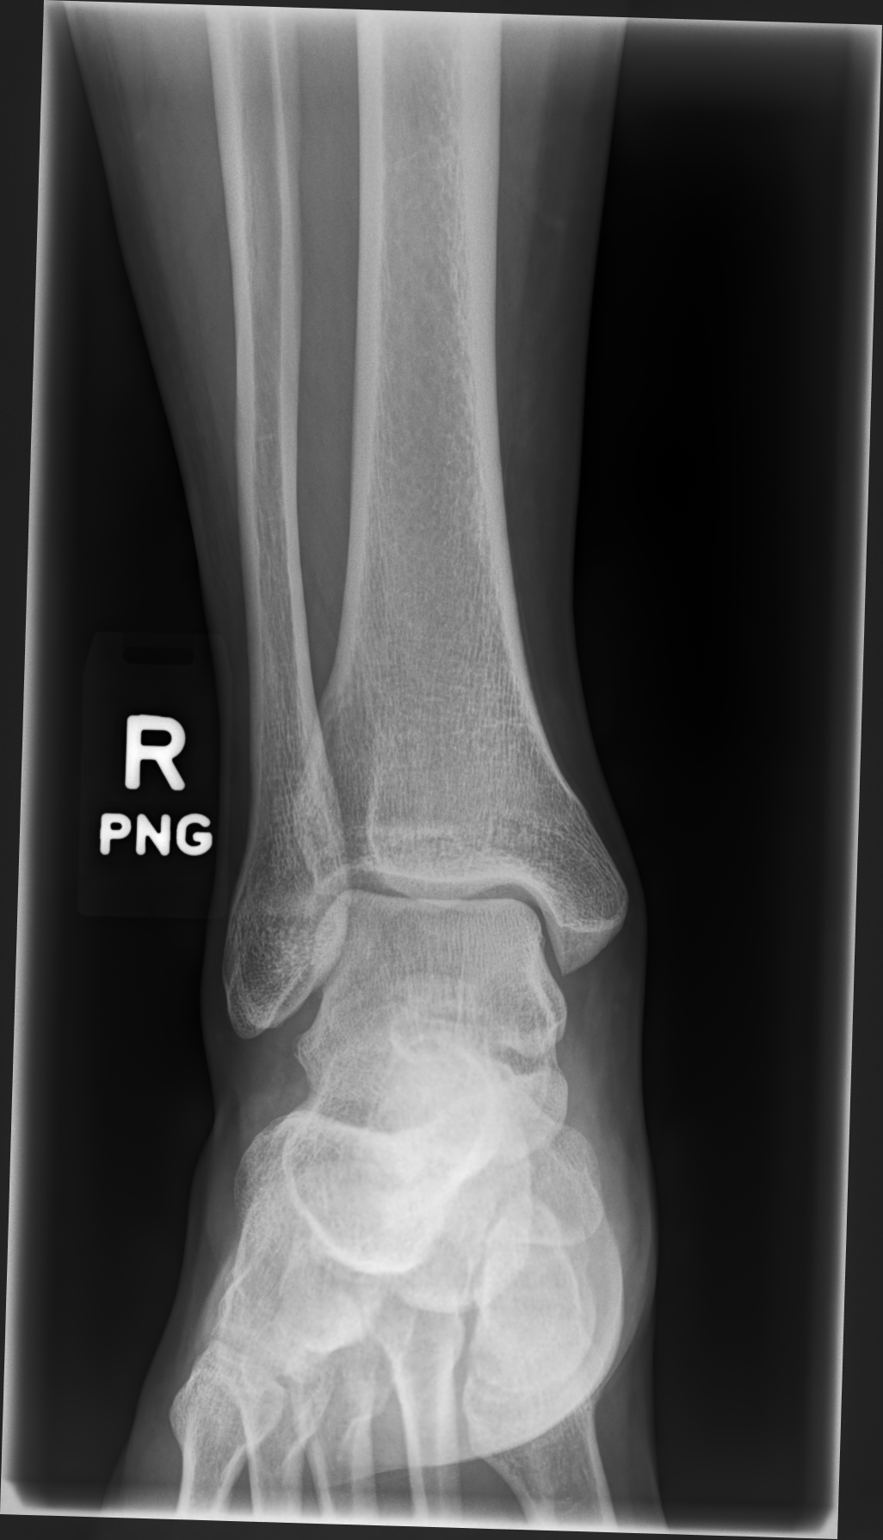

[ankle lat]
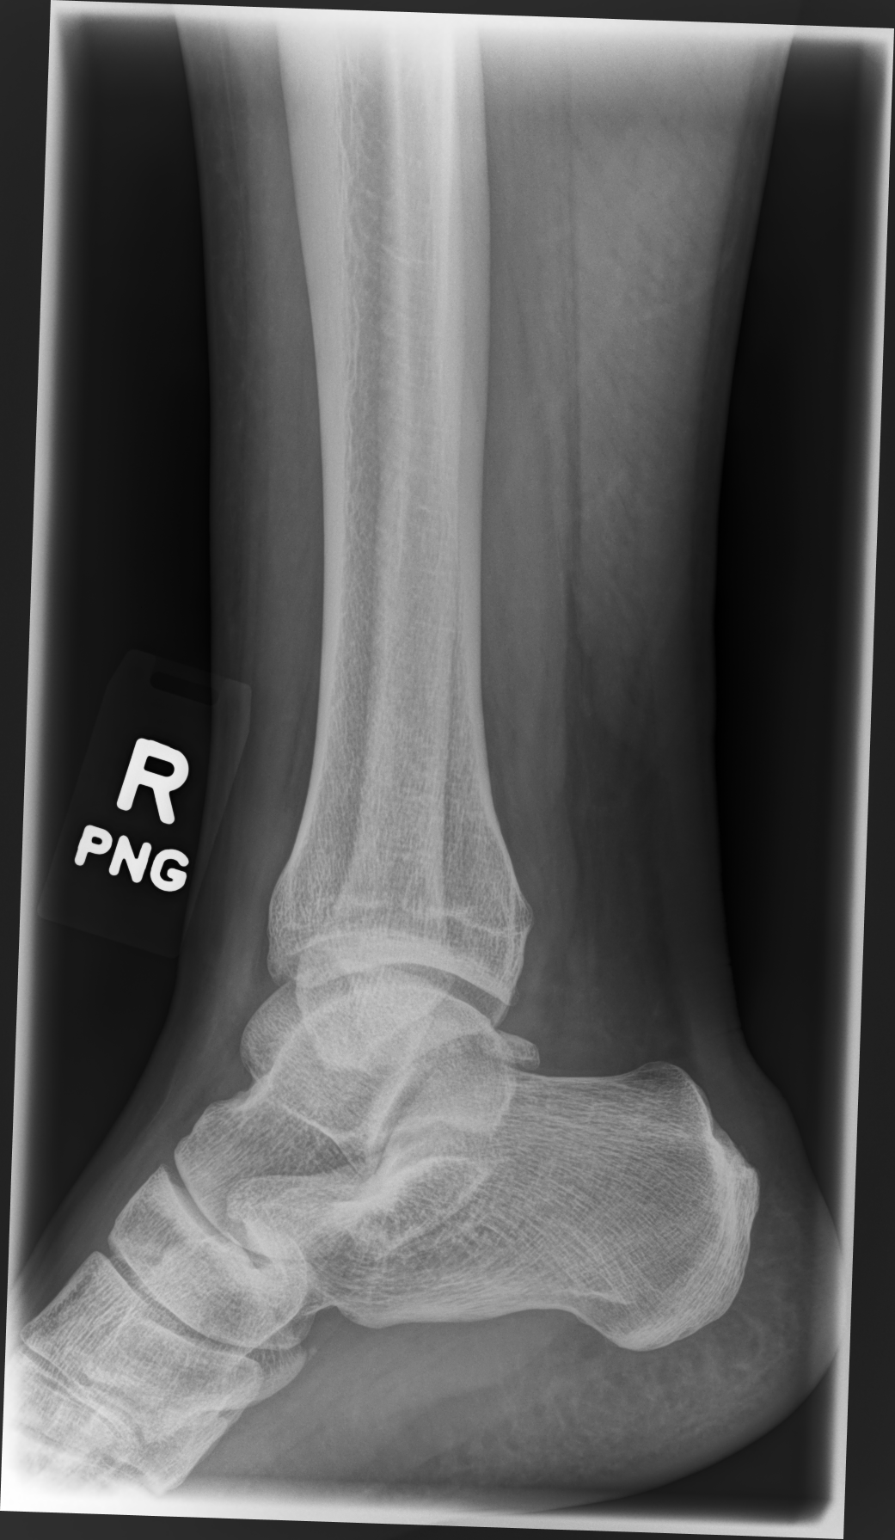

[2 of 2 positions shown; findings below may reference images not displayed]

FINDINGS: There is no evidence of fracture, dislocation, or joint effusion.
There is no evidence of arthropathy or other focal bone abnormality.
Soft tissues are unremarkable.
IMPRESSION: No acute abnormality noted.

## 2021-11-12 ENCOUNTER — Encounter: Payer: Self-pay | Admitting: Nurse Practitioner

## 2021-11-12 ENCOUNTER — Ambulatory Visit: Payer: BC Managed Care – PPO | Admitting: Nurse Practitioner

## 2021-11-12 VITALS — BP 142/92 | HR 75 | Temp 97.4°F | Resp 12 | Ht 70.0 in | Wt 280.5 lb

## 2021-11-12 DIAGNOSIS — Z23 Encounter for immunization: Secondary | ICD-10-CM

## 2021-11-12 DIAGNOSIS — Z0000 Encounter for general adult medical examination without abnormal findings: Secondary | ICD-10-CM

## 2021-11-12 DIAGNOSIS — H6123 Impacted cerumen, bilateral: Secondary | ICD-10-CM | POA: Diagnosis not present

## 2021-11-12 DIAGNOSIS — J4540 Moderate persistent asthma, uncomplicated: Secondary | ICD-10-CM

## 2021-11-12 DIAGNOSIS — R0789 Other chest pain: Secondary | ICD-10-CM | POA: Insufficient documentation

## 2021-11-12 DIAGNOSIS — R030 Elevated blood-pressure reading, without diagnosis of hypertension: Secondary | ICD-10-CM | POA: Insufficient documentation

## 2021-11-12 LAB — CBC
HCT: 43.7 % (ref 39.0–52.0)
Hemoglobin: 14.8 g/dL (ref 13.0–17.0)
MCHC: 33.9 g/dL (ref 30.0–36.0)
MCV: 87.4 fl (ref 78.0–100.0)
Platelets: 211 10*3/uL (ref 150.0–400.0)
RBC: 5 Mil/uL (ref 4.22–5.81)
RDW: 12.6 % (ref 11.5–15.5)
WBC: 7.8 10*3/uL (ref 4.0–10.5)

## 2021-11-12 LAB — COMPREHENSIVE METABOLIC PANEL
ALT: 42 U/L (ref 0–53)
AST: 25 U/L (ref 0–37)
Albumin: 4.5 g/dL (ref 3.5–5.2)
Alkaline Phosphatase: 48 U/L (ref 39–117)
BUN: 18 mg/dL (ref 6–23)
CO2: 27 mEq/L (ref 19–32)
Calcium: 9.3 mg/dL (ref 8.4–10.5)
Chloride: 101 mEq/L (ref 96–112)
Creatinine, Ser: 0.9 mg/dL (ref 0.40–1.50)
GFR: 109.75 mL/min (ref >60.00)
Glucose, Bld: 85 mg/dL (ref 70–99)
Potassium: 4.1 mEq/L (ref 3.5–5.1)
Sodium: 136 mEq/L (ref 135–145)
Total Bilirubin: 1.9 mg/dL — ABNORMAL HIGH (ref 0.2–1.2)
Total Protein: 7 g/dL (ref 6.0–8.3)

## 2021-11-12 LAB — LIPID PANEL
Cholesterol: 155 mg/dL (ref 0–200)
HDL: 45.8 mg/dL (ref >39.00)
LDL Cholesterol: 86 mg/dL (ref 0–99)
NonHDL: 109.68
Total CHOL/HDL Ratio: 3
Triglycerides: 117 mg/dL (ref 0.0–149.0)
VLDL: 23.4 mg/dL (ref 0.0–40.0)

## 2021-11-12 LAB — HEMOGLOBIN A1C: Hgb A1c MFr Bld: 5.6 % (ref 4.6–6.5)

## 2021-11-12 LAB — TSH: TSH: 4.65 u[IU]/mL (ref 0.35–5.50)

## 2021-11-12 MED ORDER — BUDESONIDE-FORMOTEROL FUMARATE 160-4.5 MCG/ACT IN AERO: 2.0000 | INHALATION_SPRAY | Freq: Two times a day (BID) | RESPIRATORY_TRACT | 3 refills | Status: DC

## 2021-11-12 MED ORDER — ALBUTEROL SULFATE HFA 108 (90 BASE) MCG/ACT IN AERS: 1.0000 | INHALATION_SPRAY | Freq: Four times a day (QID) | RESPIRATORY_TRACT | 0 refills | Status: DC | PRN

## 2021-11-12 NOTE — Assessment & Plan Note (Signed)
Elevated blood pressure in office.  Was slightly better upon recheck.  Did review last office notes most visits he was normotensive.  Has the ability to check blood pressure at home.  Told him to spot check numbers

## 2021-11-12 NOTE — Assessment & Plan Note (Signed)
Discussed age-appropriate immunization screening exams.  Appropriate orders placed in office

## 2021-11-12 NOTE — Assessment & Plan Note (Signed)
Did encourage healthy lifestyle modifications.  Patient is working on cutting out sodas and starting to work on exercise.  Continue work on healthy lifestyle modifications

## 2021-11-12 NOTE — Progress Notes (Signed)
New Patient Office Visit  Subjective    Patient ID: SHANNA STRENGTH, male    DOB: 03/04/1986  Age: 36 y.o. MRN: [id number]  CC:  Chief Complaint  Patient presents with   Establish Care    Previous PCP Dr Ceasar Lund something closer   Medication Refill    HPI NADAV SWINDELL presents to establish care   Asthma: Symbicort daily use. States that he was seen by allergy specialist and dx with GERD has been on esomeprazole  for complete physical and follow up of chronic conditions.  Immunizations: -Tetanus: up date today -Influenza: Out of season -Covid-19: Radiographer, therapeutic x2 and booster -Shingles: Too young -Pneumonia: Too young   Diet: Fair diet. Stopping sodas but does energy drink. He will eat small breakfast, lunch sandwich and chips but does not finish, at home it is general take out. On the weekend he will skip lunch Exercise: No regular exercise. States with his employment he is on his feet a lot. States he has a callus on the left foot. Working on [employer] lifting   Eye exam: PRN Dental exam: Completes semi-annually    Colonoscopy: Too young, currently average risk Lung Cancer Screening: N/A Dexa: N/A  PSA: Too young, currently average risk  Sleep: 1130-12 and get 430 and does not feel rested. Snore. States on the weekends he sleeps more.   Headaches: approx once a month he will get migraine headahces. Will have nausea and vomiting.  Will have milder ones that are a couple times a week then have spells without headaches. Will have them over his eyes.  Excedrin helps with it. Tight squeezing pain. 1.5 hours after the medication it will help.   Outpatient Encounter Medications as of 11/12/2021  Medication Sig   cetirizine (ZYRTEC) 10 MG tablet Take 10 mg by mouth daily.   esomeprazole (NEXIUM) 40 MG capsule TK 1 C PO QAM   fluticasone (FLONASE) 50 MCG/ACT nasal spray Place 1 spray into both nostrils daily.   [DISCONTINUED] budesonide-formoterol (SYMBICORT) 160-4.5  MCG/ACT inhaler Inhale 2 puffs into the lungs 2 (two) times daily.   albuterol (VENTOLIN HFA) 108 (90 Base) MCG/ACT inhaler Inhale 1 puff into the lungs every 6 (six) hours as needed for wheezing or shortness of breath.   budesonide-formoterol (SYMBICORT) 160-4.5 MCG/ACT inhaler Inhale 2 puffs into the lungs 2 (two) times daily.   [DISCONTINUED] albuterol (PROVENTIL HFA;VENTOLIN HFA) 108 (90 Base) MCG/ACT inhaler Inhale 1 puff into the lungs every 6 (six) hours as needed for wheezing or shortness of breath.   [DISCONTINUED] azelastine (ASTELIN) 0.1 % nasal spray Place 1 spray into both nostrils 2 (two) times daily. Use in each nostril as directed   [DISCONTINUED] Olopatadine HCl 0.2 % SOLN Apply 1 drop to eye 2 (two) times daily as needed.   [DISCONTINUED] Vitamin D, Ergocalciferol, (DRISDOL) 1.25 MG (50000 UNIT) CAPS capsule Take 1 capsule (50,000 Units total) by mouth every 7 (seven) days.   No facility-administered encounter medications on file as of 11/12/2021.    Past Medical History:  Diagnosis Date   Allergy    Asthma    GERD (gastroesophageal reflux disease)     Past Surgical History:  Procedure Laterality Date   UPPER GI ENDOSCOPY     Wisdom surgery      Family History  Problem Relation Age of Onset   Allergic rhinitis Father    Heart disease Other        paternal great uncle, and another one had some bypass surgeries  Social History   Socioeconomic History   Marital status: Married    Spouse name: Not on file   Number of children: 0   Years of education: HS   Highest education level: High school graduate  Occupational History   Occupation: Administrator, Civil Service   Tobacco Use   Smoking status: Never   Smokeless tobacco: Never  Vaping Use   Vaping Use: Never used  Substance and Sexual Activity   Alcohol use: Yes    Comment: 2-3 times a week. 3 at the most   Drug use: No   Sexual activity: Not on file  Other Topics Concern   Not on file  Social History  Narrative   Caffeine: 3-4 drinks a week       Fulltime: Administrator, Civil Service   Social Determinants of Corporate investment banker Strain: Not on file  Food Insecurity: Not on file  Transportation Needs: Not on file  Physical Activity: Not on file  Stress: Not on file  Social Connections: Not on file  Intimate Partner Violence: Not on file    Review of Systems  Constitutional:  Positive for malaise/fatigue. Negative for chills and fever.  Respiratory:  Negative for shortness of breath.   Cardiovascular:  Positive for chest pain (intermittently. left sided described as a tightness and does not last long.) and leg swelling.  Gastrointestinal:  Negative for abdominal pain, diarrhea, nausea and vomiting.       BM daily  Genitourinary:  Negative for dysuria and hematuria.  Neurological:  Positive for tingling (left great toe) and headaches.  Psychiatric/Behavioral:  Negative for hallucinations and suicidal ideas.         Objective    BP (!) 142/92   Pulse 75   Temp (!) 97.4 F (36.3 C)   Resp 12   Ht 5\' 10"  (1.778 m)   Wt 280 lb 8 oz (127.2 kg)   SpO2 97%   BMI 40.25 kg/m   Physical Exam Vitals and nursing note reviewed. Exam conducted with a chaperone present [facility] [place], RMA).  Constitutional:      Appearance: Normal appearance. He is obese.  HENT:     Right Ear: Ear canal normal. There is impacted cerumen.     Left Ear: Ear canal and external ear normal. There is impacted cerumen.     Mouth/Throat:     Mouth: Mucous membranes are moist.     Pharynx: Oropharynx is clear.  Eyes:     Extraocular Movements: Extraocular movements intact.     Pupils: Pupils are equal, round, and reactive to light.  Cardiovascular:     Rate and Rhythm: Normal rate and regular rhythm.     Pulses: Normal pulses.     Heart sounds: Normal heart sounds.  Pulmonary:     Effort: Pulmonary effort is normal.     Breath sounds: Normal breath sounds.  Abdominal:     General: Bowel  sounds are normal. There is no distension.     Palpations: There is no mass.     Tenderness: There is no abdominal tenderness.     Hernia: No hernia is present. There is no hernia in the left inguinal area or right inguinal area.  Genitourinary:    Penis: Normal.      Testes: Normal.     Epididymis:     Right: Normal.     Left: Normal.  Musculoskeletal:     Right lower leg: Edema present.     Left lower leg: Edema  present.     Comments: Trace edema non pitting  Lymphadenopathy:     Cervical: No cervical adenopathy.     Lower Body: No right inguinal adenopathy. No left inguinal adenopathy.  Skin:    General: Skin is warm.  Neurological:     General: No focal deficit present.     Mental Status: He is alert.     Deep Tendon Reflexes:     Reflex Scores:      Bicep reflexes are 2+ on the right side and 2+ on the left side.      Patellar reflexes are 2+ on the right side and 2+ on the left side.    Comments: Bilateral upper and lower extremity strength 5/5  Psychiatric:        Mood and Affect: Mood normal.        Behavior: Behavior normal.        Thought Content: Thought content normal.        Judgment: Judgment normal.         Assessment & Plan:   Problem List Items Addressed This Visit       Respiratory   Moderate persistent asthma, uncomplicated    Patient has been maintained on Symbicort and well.  We will refill his Symbicort along with an albuterol inhaler for as needed use.      Relevant Medications   budesonide-formoterol (SYMBICORT) 160-4.5 MCG/ACT inhaler   albuterol (VENTOLIN HFA) 108 (90 Base) MCG/ACT inhaler     Nervous and Auditory   Bilateral impacted cerumen    Verbal consent obtained.  Patient was prepared using cerumen softening eardrops.  Per office policy mixture of water hydroperoxide was used to irrigate bilateral ears.  Patient tolerated procedure decently well with some lightheadedness.  We were able to remove impactions.      Relevant Orders    Ear Lavage     Other   Morbid obesity (HCC)    Did encourage healthy lifestyle modifications.  Patient is working on cutting out sodas and starting to work on exercise.  Continue work on healthy lifestyle modifications      Relevant Orders   Lipid panel   TSH   Hemoglobin A1c   Atypical chest pain - Primary    To factorial.  EKG within normal limits in office today.  Patient is concerned as there is family history of cardiovascular disease.      Relevant Orders   TSH   EKG 12-Lead (Completed)   Preventative health care    Discussed age-appropriate immunization screening exams.  Appropriate orders placed in office      Relevant Orders   CBC   Comprehensive metabolic panel   Lipid panel   TSH   Hemoglobin A1c   Elevated blood pressure reading in office without diagnosis of hypertension    Elevated blood pressure in office.  Was slightly better upon recheck.  Did review last office notes most visits he was normotensive.  Has the ability to check blood pressure at home.  Told him to spot check numbers      Other Visit Diagnoses     Need for prophylactic vaccination against diphtheria and tetanus           Return in about 1 year (around 11/13/2022) for CPE and Labs.   Audria Nine, NP

## 2021-11-12 NOTE — Assessment & Plan Note (Addendum)
Verbal consent obtained.  Patient was prepared using cerumen softening eardrops.  Per office policy mixture of water hydroperoxide was used to irrigate bilateral ears.  Patient tolerated procedure decently well with some lightheadedness.  We were able to remove impactions.

## 2021-11-12 NOTE — Assessment & Plan Note (Signed)
Patient has been maintained on Symbicort and well.  We will refill his Symbicort along with an albuterol inhaler for as needed use.

## 2021-11-12 NOTE — Assessment & Plan Note (Signed)
To factorial.  EKG within normal limits in office today.  Patient is concerned as there is family history of cardiovascular disease.

## 2021-11-12 NOTE — Patient Instructions (Signed)
Nice to see you today EKG looks good in office I will be in touch with the labs once I have them Follow up with me in 1 year, Sooner if you need me Work on your exercise If you can spot check your blood pressure that would be ideal

## 2022-01-02 ENCOUNTER — Other Ambulatory Visit: Payer: Self-pay | Admitting: Nurse Practitioner

## 2022-05-11 ENCOUNTER — Encounter: Payer: Self-pay | Admitting: Nurse Practitioner

## 2022-05-11 ENCOUNTER — Ambulatory Visit: Payer: BC Managed Care – PPO | Admitting: Nurse Practitioner

## 2022-05-11 VITALS — BP 146/98 | HR 87 | Ht 70.0 in | Wt 278.0 lb

## 2022-05-11 DIAGNOSIS — G478 Other sleep disorders: Secondary | ICD-10-CM

## 2022-05-11 DIAGNOSIS — R42 Dizziness and giddiness: Secondary | ICD-10-CM | POA: Insufficient documentation

## 2022-05-11 LAB — CBC
HCT: 44.8 % (ref 39.0–52.0)
Hemoglobin: 15.4 g/dL (ref 13.0–17.0)
MCHC: 34.3 g/dL (ref 30.0–36.0)
MCV: 88.2 fl (ref 78.0–100.0)
Platelets: 222 10*3/uL (ref 150.0–400.0)
RBC: 5.08 Mil/uL (ref 4.22–5.81)
RDW: 12.5 % (ref 11.5–15.5)
WBC: 8.1 10*3/uL (ref 4.0–10.5)

## 2022-05-11 LAB — COMPREHENSIVE METABOLIC PANEL
ALT: 39 U/L (ref 0–53)
AST: 22 U/L (ref 0–37)
Albumin: 4.4 g/dL (ref 3.5–5.2)
Alkaline Phosphatase: 48 U/L (ref 39–117)
BUN: 15 mg/dL (ref 6–23)
CO2: 29 mEq/L (ref 19–32)
Calcium: 9.4 mg/dL (ref 8.4–10.5)
Chloride: 102 mEq/L (ref 96–112)
Creatinine, Ser: 0.93 mg/dL (ref 0.40–1.50)
GFR: 105.15 mL/min (ref >60.00)
Glucose, Bld: 97 mg/dL (ref 70–99)
Potassium: 4.2 mEq/L (ref 3.5–5.1)
Sodium: 137 mEq/L (ref 135–145)
Total Bilirubin: 1.5 mg/dL — ABNORMAL HIGH (ref 0.2–1.2)
Total Protein: 7.3 g/dL (ref 6.0–8.3)

## 2022-05-11 NOTE — Assessment & Plan Note (Signed)
Patient is sleeping but not having restorative sleep.  States once he lays down he is so tired with a go to sleep.  Epworth Sleepiness Scale was 11.  Ambulatory referral made to pulmonology for OSA rule out

## 2022-05-11 NOTE — Assessment & Plan Note (Signed)
No nystagmus or signs of vertigo on exam.  Patient does sound like he needs to increase fluids will incorporate electrolyte drink daily this can be premade or a mix and powder.  Will check basic labs just to make sure he is hemodynamically stable.  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  He will update me via MyChart

## 2022-05-11 NOTE — Patient Instructions (Addendum)
Nice to see you today Work on increasing water and adding in an electrolyte drink or mix in Let me know if you do not improve I will be in touch with the labs once I have them

## 2022-05-11 NOTE — Progress Notes (Signed)
Established Patient Office Visit  Subjective   Patient ID: Jonathan Sims, male    DOB: Dec 10, 1985  Age: 37 y.o. MRN: [id number]  Chief Complaint  Patient presents with   Dizziness    Since yesterday, only slept 3 hours; worse with movement; denies nasal congestion; denies nausea   Insomnia    Not sleeping well      Dizziness: States that he feels off kilter. Started yesterday morning when he got up. States that he has to get up to turn his alarm. States that it was lingering and left early form. States that he can feel it at rest and worse with movement. Quick head movements make it worse. States that he has not tried anything   States that he will have coffee and water then 1-2 beers at night. States that his year States that he has had it before but his ear was stopped up. States that he had a build up ear wax  Sleep disturbance: States that he wants to stay up late even on the work week. States that he is trying to go to bed around 1030 and getting 6 hours of sleep. States that he has no problem going to sleep. States that he does snore and does not feel rested. States that he is able to sleep. No sleep study thus far    Review of Systems  Constitutional:  Negative for chills and fever.  Gastrointestinal:  Negative for nausea and vomiting.  Neurological:  Positive for dizziness, tingling, weakness and headaches.  Psychiatric/Behavioral:  The patient has insomnia.       Objective:     BP (!) 146/98   Pulse 87   Wt 278 lb (126.1 kg)   SpO2 97%   BMI 39.89 kg/m  BP Readings from Last 3 Encounters:  05/11/22 (!) 146/98  11/12/21 (!) 142/92  12/03/19 128/88   Wt Readings from Last 3 Encounters:  05/11/22 278 lb (126.1 kg)  11/12/21 280 lb 8 oz (127.2 kg)  12/03/19 281 lb (127.5 kg)      Physical Exam Vitals and nursing note reviewed.  Constitutional:      Appearance: Normal appearance. He is obese.  HENT:     Right Ear: Tympanic membrane, ear canal and  external ear normal.     Left Ear: Tympanic membrane, ear canal and external ear normal.     Mouth/Throat:     Mouth: Mucous membranes are moist.     Pharynx: Oropharynx is clear.  Eyes:     Extraocular Movements: Extraocular movements intact.     Pupils: Pupils are equal, round, and reactive to light.  Cardiovascular:     Rate and Rhythm: Normal rate and regular rhythm.     Heart sounds: Normal heart sounds.  Pulmonary:     Effort: Pulmonary effort is normal.     Breath sounds: Normal breath sounds.  Neurological:     General: No focal deficit present.     Mental Status: He is alert.     Cranial Nerves: Cranial nerves 2-12 are intact.     Sensory: Sensation is intact.     Gait: Gait is intact.     Deep Tendon Reflexes:     Reflex Scores:      Bicep reflexes are 2+ on the right side and 2+ on the left side.      Patellar reflexes are 2+ on the right side and 2+ on the left side.    Comments: Upper and lower  extremity strength 5/5      No results found for any visits on 05/11/22.    The ASCVD Risk score (Arnett DK, et al., 2019) failed to calculate for the following reasons:   The 2019 ASCVD risk score is only valid for ages 70 to 17    Assessment & Plan:   Problem List Items Addressed This Visit       Nervous and Auditory   Non-restorative sleep    Patient is sleeping but not having restorative sleep.  States once he lays down he is so tired with a go to sleep.  Epworth Sleepiness Scale was 11.  Ambulatory referral made to pulmonology for OSA rule out      Relevant Orders   Ambulatory referral to Pulmonology     Other   Light headedness - Primary    No nystagmus or signs of vertigo on exam.  Patient does sound like he needs to increase fluids will incorporate electrolyte drink daily this can be premade or a mix and powder.  Will check basic labs just to make sure he is hemodynamically stable.  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  He will update me via MyChart      Relevant Orders   CBC   Comprehensive metabolic panel    Return in about 6 months (around 11/14/2022), or if symptoms worsen or fail to improve, for CPE and Labs.    Romilda Garret, NP

## 2022-05-15 ENCOUNTER — Emergency Department (HOSPITAL_COMMUNITY): Payer: BC Managed Care – PPO

## 2022-05-15 ENCOUNTER — Other Ambulatory Visit: Payer: Self-pay

## 2022-05-15 ENCOUNTER — Emergency Department (HOSPITAL_COMMUNITY)
Admission: EM | Admit: 2022-05-15 | Discharge: 2022-05-15 | Disposition: A | Discharge status: Home / Self Care | Payer: BC Managed Care – PPO | Attending: Emergency Medicine | Admitting: Emergency Medicine

## 2022-05-15 DIAGNOSIS — Z7951 Long term (current) use of inhaled steroids: Secondary | ICD-10-CM | POA: Diagnosis not present

## 2022-05-15 DIAGNOSIS — J45909 Unspecified asthma, uncomplicated: Secondary | ICD-10-CM | POA: Insufficient documentation

## 2022-05-15 DIAGNOSIS — R109 Unspecified abdominal pain: Secondary | ICD-10-CM | POA: Diagnosis not present

## 2022-05-15 DIAGNOSIS — K529 Noninfective gastroenteritis and colitis, unspecified: Secondary | ICD-10-CM | POA: Diagnosis not present

## 2022-05-15 DIAGNOSIS — R112 Nausea with vomiting, unspecified: Secondary | ICD-10-CM | POA: Diagnosis not present

## 2022-05-15 DIAGNOSIS — R000 Tachycardia, unspecified: Secondary | ICD-10-CM | POA: Diagnosis not present

## 2022-05-15 DIAGNOSIS — E860 Dehydration: Secondary | ICD-10-CM | POA: Diagnosis not present

## 2022-05-15 DIAGNOSIS — R079 Chest pain, unspecified: Secondary | ICD-10-CM | POA: Diagnosis not present

## 2022-05-15 DIAGNOSIS — K760 Fatty (change of) liver, not elsewhere classified: Secondary | ICD-10-CM | POA: Diagnosis not present

## 2022-05-15 DIAGNOSIS — R1110 Vomiting, unspecified: Secondary | ICD-10-CM | POA: Diagnosis not present

## 2022-05-15 LAB — CBC WITH DIFFERENTIAL/PLATELET
Abs Immature Granulocytes: 0.07 10*3/uL (ref 0.00–0.07)
Basophils Absolute: 0.1 10*3/uL (ref 0.0–0.1)
Basophils Relative: 0 %
Eosinophils Absolute: 0 10*3/uL (ref 0.0–0.5)
Eosinophils Relative: 0 %
HCT: 50.4 % (ref 39.0–52.0)
Hemoglobin: 16.9 g/dL (ref 13.0–17.0)
Immature Granulocytes: 1 %
Lymphocytes Relative: 2 %
Lymphs Abs: 0.3 10*3/uL — ABNORMAL LOW (ref 0.7–4.0)
MCH: 30.1 pg (ref 26.0–34.0)
MCHC: 33.5 g/dL (ref 30.0–36.0)
MCV: 89.7 fL (ref 80.0–100.0)
Monocytes Absolute: 0.7 10*3/uL (ref 0.1–1.0)
Monocytes Relative: 5 %
Neutro Abs: 14 10*3/uL — ABNORMAL HIGH (ref 1.7–7.7)
Neutrophils Relative %: 92 %
Platelets: 209 10*3/uL (ref 150–400)
RBC: 5.62 MIL/uL (ref 4.22–5.81)
RDW: 11.9 % (ref 11.5–15.5)
WBC: 15.1 10*3/uL — ABNORMAL HIGH (ref 4.0–10.5)
nRBC: 0 % (ref 0.0–0.2)

## 2022-05-15 LAB — COMPREHENSIVE METABOLIC PANEL
ALT: 42 U/L (ref 0–44)
AST: 32 U/L (ref 15–41)
Albumin: 4.3 g/dL (ref 3.5–5.0)
Alkaline Phosphatase: 43 U/L (ref 38–126)
Anion gap: 16 — ABNORMAL HIGH (ref 5–15)
BUN: 18 mg/dL (ref 6–20)
CO2: 20 mmol/L — ABNORMAL LOW (ref 22–32)
Calcium: 9.2 mg/dL (ref 8.9–10.3)
Chloride: 102 mmol/L (ref 98–111)
Creatinine, Ser: 1.3 mg/dL — ABNORMAL HIGH (ref 0.61–1.24)
GFR, Estimated: >60 mL/min (ref >60)
Glucose, Bld: 142 mg/dL — ABNORMAL HIGH (ref 70–99)
Potassium: 3.9 mmol/L (ref 3.5–5.1)
Sodium: 138 mmol/L (ref 135–145)
Total Bilirubin: 2.9 mg/dL — ABNORMAL HIGH (ref 0.3–1.2)
Total Protein: 7.5 g/dL (ref 6.5–8.1)

## 2022-05-15 LAB — URINALYSIS, ROUTINE W REFLEX MICROSCOPIC
Bacteria, UA: NONE SEEN
Bilirubin Urine: NEGATIVE
Glucose, UA: NEGATIVE mg/dL
Hgb urine dipstick: NEGATIVE
Ketones, ur: 20 mg/dL — AB
Leukocytes,Ua: NEGATIVE
Nitrite: NEGATIVE
Protein, ur: 100 mg/dL — AB
Specific Gravity, Urine: 1.029 (ref 1.005–1.030)
pH: 6 (ref 5.0–8.0)

## 2022-05-15 LAB — ETHANOL: Alcohol, Ethyl (B): <10 mg/dL (ref <10)

## 2022-05-15 LAB — TROPONIN I (HIGH SENSITIVITY)
Troponin I (High Sensitivity): 4 ng/L (ref <18)
Troponin I (High Sensitivity): 7 ng/L (ref <18)

## 2022-05-15 LAB — LIPASE, BLOOD: Lipase: 33 U/L (ref 11–51)

## 2022-05-15 MED ORDER — LACTATED RINGERS IV BOLUS
1000.0000 mL | Freq: Once | INTRAVENOUS | Status: AC
  Administered 2022-05-15: 1000 mL via INTRAVENOUS

## 2022-05-15 MED ORDER — IOHEXOL 350 MG/ML SOLN
75.0000 mL | Freq: Once | INTRAVENOUS | Status: AC | PRN
  Administered 2022-05-15: 75 mL via INTRAVENOUS

## 2022-05-15 MED ORDER — ONDANSETRON HCL 4 MG/2ML IJ SOLN
4.0000 mg | Freq: Once | INTRAMUSCULAR | Status: AC
  Administered 2022-05-15: 4 mg via INTRAVENOUS
  Filled 2022-05-15: qty 2

## 2022-05-15 MED ORDER — ONDANSETRON 8 MG PO TBDP: 8.0000 mg | ORAL_TABLET | Freq: Three times a day (TID) | ORAL | 0 refills | Status: DC | PRN

## 2022-05-15 NOTE — ED Notes (Signed)
Patient transported to CT 

## 2022-05-15 NOTE — Discharge Instructions (Addendum)
You are seen today in the emergency department for nausea, vomiting and diarrhea.  Your workup today showed signs of dehydration which we treated with fluids.  CT showed signs consistent with gastroenteritis which is a stomach bug, no emergent abdominal condition.  Drink plenty of fluids, eat plain foods diet recommendations attached below.  You take Zofran every 8 hours as needed for nausea.  You should feel better in the next 24 hours, if you have no improvement in 48 hours you should return to the ED for evaluation or see your primary doctor.  Return to ED for chest pain, loss conscious, inability eat or drink without vomiting or new or concerning symptoms.    Narrative  CLINICAL DATA:  Right lower quadrant abdominal pain.    EXAM:  CT ABDOMEN AND PELVIS WITH CONTRAST    TECHNIQUE:  Multidetector CT imaging of the abdomen and pelvis was performed  using the standard protocol following bolus administration of  intravenous contrast.    RADIATION DOSE REDUCTION: This exam was performed according to the  departmental dose-optimization program which includes automated  exposure control, adjustment of the mA and/or kV according to  patient size and/or use of iterative reconstruction technique.    CONTRAST:  47mL OMNIPAQUE IOHEXOL 350 MG/ML SOLN    COMPARISON:  None Available.    FINDINGS:  Lower chest: No acute abnormality.    Hepatobiliary: No focal liver abnormality is seen. Diffuse low  attenuation of the liver as can be seen with hepatic steatosis. No  gallstones, gallbladder wall thickening, or biliary dilatation.    Pancreas: Unremarkable. No pancreatic ductal dilatation or  surrounding inflammatory changes.    Spleen: Normal in size without focal abnormality.    Adrenals/Urinary Tract: Adrenal glands are unremarkable. Kidneys are  normal, without renal calculi, focal lesion, or hydronephrosis.  Bladder is unremarkable.    Stomach/Bowel: Fluid-filled stomach and multiple  fluid-filled loops  of small bowel. Small amount of fluid in the ascending colon.  Diverticulosis without evidence of diverticulitis. No evidence of  bowel wall thickening, distention, or inflammatory changes. Appendix  is normal.    Vascular/Lymphatic: No significant vascular findings are present. No  enlarged abdominal or pelvic lymph nodes.    Reproductive: Prostate is unremarkable.    Other: No abdominal wall hernia or abnormality. No abdominopelvic  ascites.    Musculoskeletal: No acute osseous abnormality. No aggressive osseous  lesion.    IMPRESSION:  1. Fluid-filled stomach and multiple fluid-filled loops of small  bowel. Small amount of fluid in the ascending colon. Findings are  most consistent with gastroenteritis.  2. Normal appendix.  3. Hepatic steatosis.      Electronically Signed    By: [place] Service.D.

## 2022-05-15 NOTE — ED Provider Triage Note (Addendum)
Emergency Medicine Provider Triage Evaluation Note  Jonathan Sims , a 37 y.o. male  was evaluated in triage.  Pt complains of nausea, vomiting, and diarrhea since last evening.  He reports eating tacos with family consuming 5-6 beers which is not an unusual amount for him.  He states he is thrown up violently 10-12 times and has had several episodes of loose, watery diarrhea.  He states he feels dehydrated.  Also reports some chest discomfort, thinks it may be from vomiting.  Has only been able to hold down a few sips of water.  Review of Systems  Positive: N/V/D Negative: fever  Physical Exam  BP (!) 153/101 (BP Location: Left Arm)   Pulse (!) 139   Temp (!) 97.5 F (36.4 C)   Resp (!) 23   SpO2 99%   Gen:   Awake, no distress   Resp:  Normal effort  MSK:   Moves extremities without difficulty  Other:  Tachy in triage, lips and tongue appear dry  Medical Decision Making  Medically screening exam initiated at 3:36 AM.  Appropriate orders placed.  Brent General was informed that the remainder of the evaluation will be completed by another provider, this initial triage assessment does not replace that evaluation, and the importance of remaining in the ED until their evaluation is complete.  N/V/D, now having some chest discomfort.  EtOH yesterday evening with dinner.  Tachy in triage, seems dry.  EKG, labs, CXR.   Larene Pickett, PA-C 05/15/22 [place], [place], PA-C 05/15/22 [phone]

## 2022-05-15 NOTE — ED Provider Notes (Signed)
[place] EMERGENCY DEPARTMENT AT [facility] Provider Note   CSN: [id number] Arrival date & time: 05/15/22  0309     History  Chief Complaint  Patient presents with   Nausea   Emesis    Jonathan Sims is a 37 y.o. male.   Emesis    Patient with medical history of GERD, asthma, obesity presents to the emergency department due to nausea, vomiting and diarrhea.  Happened acutely yesterday evening about 8 PM after eating tacos.  Associated with diarrhea.  No bloody bowel movements, hematochezia, hematemesis.  He has some pain in his abdomen when he vomits but otherwise is asymptomatic.  Denies any chest pain or shortness of breath.  No previous abdominal surgeries, no fevers, does report subjective chills.    Patient also endorses alcohol use yesterday having 5 beers in the course of 5 hours.  Typical for him, denies any history of alcohol withdrawal syndrome, seizures, alcohol abuse disorder.  Home Medications Prior to Admission medications   Medication Sig Start Date End Date Taking? Authorizing Provider  ondansetron (ZOFRAN-ODT) 8 MG disintegrating tablet Take 1 tablet (8 mg total) by mouth every 8 (eight) hours as needed for nausea or vomiting. 05/15/22  Yes Theron Arista, PA-C  albuterol (VENTOLIN HFA) 108 (90 Base) MCG/ACT inhaler INHALE 1 PUFF INTO THE LUNGS EVERY 6 HOURS AS NEEDED FOR WHEEZING OR SHORTNESS OF BREATH 01/03/22   Eden Emms, NP  budesonide-formoterol (SYMBICORT) 160-4.5 MCG/ACT inhaler Inhale 2 puffs into the lungs 2 (two) times daily. 11/12/21   Eden Emms, NP  cetirizine (ZYRTEC) 10 MG tablet Take 10 mg by mouth daily.    [provider]  esomeprazole (NEXIUM) 40 MG capsule TK 1 C PO QAM 08/17/17   Alfonse Spruce, MD  fluticasone [facility]) 50 MCG/ACT nasal spray Place 1 spray into both nostrils daily. 08/17/17   Alfonse Spruce, MD      Allergies    Patient has no known allergies.    Review of Systems   Review of Systems   Gastrointestinal:  Positive for vomiting.    Physical Exam Updated Vital Signs BP 123/80 (BP Location: Right Arm)   Pulse 100   Temp 98.2 F (36.8 C) (Oral)   Resp 15   SpO2 100%  Physical Exam Vitals and nursing note reviewed. Exam conducted with a chaperone present.  Constitutional:      Appearance: Normal appearance.  HENT:     Head: Normocephalic and atraumatic.     Mouth/Throat:     Mouth: Mucous membranes are dry.  Eyes:     General: No scleral icterus.       Right eye: No discharge.        Left eye: No discharge.     Extraocular Movements: Extraocular movements intact.     Pupils: Pupils are equal, round, and reactive to light.  Cardiovascular:     Rate and Rhythm: Regular rhythm. Tachycardia present.     Pulses: Normal pulses.     Heart sounds: Normal heart sounds. No murmur heard.    No friction rub. No gallop.     Comments: Tachycardic with regular rhythm, upper and lower extremity pulses are symmetric bilaterally Pulmonary:     Effort: Pulmonary effort is normal. No respiratory distress.     Breath sounds: Normal breath sounds.  Abdominal:     General: Abdomen is flat. Bowel sounds are normal. There is no distension.     Palpations: Abdomen is soft.  Tenderness: There is abdominal tenderness.     Comments: Generalized TTP without rigidity or guarding  Skin:    General: Skin is warm and dry.     Coloration: Skin is not jaundiced.  Neurological:     Mental Status: He is alert. Mental status is at baseline.     Coordination: Coordination normal.     ED Results / Procedures / Treatments   Labs (all labs ordered are listed, but only abnormal results are displayed) Labs Reviewed  CBC WITH DIFFERENTIAL/PLATELET - Abnormal; Notable for the following components:      Result Value   WBC 15.1 (*)    Neutro Abs 14.0 (*)    Lymphs Abs 0.3 (*)    All other components within normal limits  COMPREHENSIVE METABOLIC PANEL - Abnormal; Notable for the following  components:   CO2 20 (*)    Glucose, Bld 142 (*)    Creatinine, Ser 1.30 (*)    Total Bilirubin 2.9 (*)    Anion gap 16 (*)    All other components within normal limits  URINALYSIS, ROUTINE W REFLEX MICROSCOPIC - Abnormal; Notable for the following components:   Color, Urine AMBER (*)    APPearance HAZY (*)    Ketones, ur 20 (*)    Protein, ur 100 (*)    All other components within normal limits  ETHANOL  LIPASE, BLOOD  TROPONIN I (HIGH SENSITIVITY)  TROPONIN I (HIGH SENSITIVITY)    EKG EKG Interpretation  Date/Time:  Sunday May 15 2022 04:01:21 EST Ventricular Rate:  154 PR Interval:  120 QRS Duration: 76 QT Interval:  260 QTC Calculation: 416 R Axis:   55 Text Interpretation: Sinus tachycardia Otherwise normal ECG When compared with ECG of 06-Jun-2015 23:57, PREVIOUS ECG IS PRESENT Confirmed by Octaviano Glow [phone]) on 05/15/2022 7:50:47 AM  Radiology CT ABDOMEN PELVIS W CONTRAST  Result Date: 05/15/2022 CLINICAL DATA:  Right lower quadrant abdominal pain. EXAM: CT ABDOMEN AND PELVIS WITH CONTRAST TECHNIQUE: Multidetector CT imaging of the abdomen and pelvis was performed using the standard protocol following bolus administration of intravenous contrast. RADIATION DOSE REDUCTION: This exam was performed according to the departmental dose-optimization program which includes automated exposure control, adjustment of the mA and/or kV according to patient size and/or use of iterative reconstruction technique. CONTRAST:  75mL OMNIPAQUE IOHEXOL 350 MG/ML SOLN COMPARISON:  None Available. FINDINGS: Lower chest: No acute abnormality. Hepatobiliary: No focal liver abnormality is seen. Diffuse low attenuation of the liver as can be seen with hepatic steatosis. No gallstones, gallbladder wall thickening, or biliary dilatation. Pancreas: Unremarkable. No pancreatic ductal dilatation or surrounding inflammatory changes. Spleen: Normal in size without focal abnormality. Adrenals/Urinary  Tract: Adrenal glands are unremarkable. Kidneys are normal, without renal calculi, focal lesion, or hydronephrosis. Bladder is unremarkable. Stomach/Bowel: Fluid-filled stomach and multiple fluid-filled loops of small bowel. Small amount of fluid in the ascending colon. Diverticulosis without evidence of diverticulitis. No evidence of bowel wall thickening, distention, or inflammatory changes. Appendix is normal. Vascular/Lymphatic: No significant vascular findings are present. No enlarged abdominal or pelvic lymph nodes. Reproductive: Prostate is unremarkable. Other: No abdominal wall hernia or abnormality. No abdominopelvic ascites. Musculoskeletal: No acute osseous abnormality. No aggressive osseous lesion. IMPRESSION: 1. Fluid-filled stomach and multiple fluid-filled loops of small bowel. Small amount of fluid in the ascending colon. Findings are most consistent with gastroenteritis. 2. Normal appendix. 3. Hepatic steatosis. Electronically Signed   By: Kathreen Devoid M.D.   On: 05/15/2022 09:23   DG Chest 2  View  Result Date: 05/15/2022 CLINICAL DATA:  Chest pain, vomiting. EXAM: CHEST - 2 VIEW COMPARISON:  06/15/2015. FINDINGS: The heart size and mediastinal contours are within normal limits. Both lungs are clear. No acute osseous abnormality. IMPRESSION: No active cardiopulmonary disease. Electronically Signed   By: Brett Fairy M.D.   On: 05/15/2022 04:11    Procedures Procedures    Medications Ordered in ED Medications  ondansetron (ZOFRAN) injection 4 mg (4 mg Intravenous Given 05/15/22 0759)  lactated ringers bolus 1,000 mL (0 mLs Intravenous Stopped 05/15/22 0926)  lactated ringers bolus 1,000 mL (1,000 mLs Intravenous New Bag/Given 05/15/22 0926)  iohexol (OMNIPAQUE) 350 MG/ML injection 75 mL (75 mLs Intravenous Contrast Given 05/15/22 0913)    ED Course/ Medical Decision Making/ A&P Clinical Course as of 05/15/22 1032  Sun May 15, 2022  0848 This is a 37 year old male presenting to ED  with nausea, vomiting, diarrhea, onset yesterday evening.  Patient reports that he had tacos for dinner, which several family members also ate, no one else has been sick.  He began to feel queasy about 2 or 3 hours after eating his food, had persistent vomiting initially, then loose bowel movements overnight.  He reports cramping pain of the abdomen.  Denies that he takes any medicine at baseline, or denies history of abdominal surgery.  On exam he is tachycardic, blood pressure mildly hypertensive, does not appear toxic.  He does not have any rigidity or guarding on abdominal exam but he does have some tenderness which is suprapubic and right lower quadrant.  His labs are notable for leukocytosis white blood cell count 15.1.  Patient was ordered Zofran for nausea, IV fluids for tachycardia.  He is pending a CT scan to evaluate for possible appendicitis.  Alternatively this may be an issue of a viral gastroenteritis versus foodborne illness, and he may just have a reactive leukocytosis.  The patient's wife was also present for exam and workup [MT]  0906 CBC with Differential(!) Leukocytosis of 08.6 with neutrophilic predominance. [HS]  0906 Ethanol Nondetectable [HS]  0906 Lipase, blood Within normal limits [HS]  0906 Troponin I (High Sensitivity) Low, no ischemic change on EKG presentation, not consistent with ACS. [HS]  [id number] Urinalysis, Routine w reflex microscopic(!) No UTI or hematuria, proteinuria and ketonuria consistent with dehydration [HS]  0906 Comprehensive metabolic panel(!) Patient has mild AKI with creatinine 1.3, no gross electrolyte derangement or transaminitis.  Slight anion gap of 16 likely secondary to dehydration ketosis. [HS]  [id number] CT consistent with likely viral gastroenteritis.  Normal appendix.  We will p.o. challenge the patient, and anticipate discharge if able to keep down fluids. [MT]    Clinical Course User Index [HS] Sherrill Raring, PA-C [MT] Langston Masker, Carola Rhine, MD                              Medical Decision Making Amount and/or Complexity of Data Reviewed Independent Historian: spouse    Details: In HPI External Data Reviewed: notes.    Details: No prior surgeries Labs: ordered. Decision-making details documented in ED Course.    Details: See ED course Radiology: ordered and independent interpretation performed. Decision-making details documented in ED Course. ECG/medicine tests: ordered and independent interpretation performed. Decision-making details documented in ED Course.    Details: Sinus tachycardia without any specific ischemic changes  Risk Prescription drug management.   This is a 37 year old male with history of GERD presenting to the emergency  department due to nausea, vomiting and diarrhea.  Differential includes but not limited to dehydration, AKI, metabolic abnormality, colitis, gastroenteritis, alcoholic ketosis, UTI, pancreatitis, cholecystitis, sepsis, dissection, atypical ACS, AAA, arrhythmia, electrolyte derangement  On exam patient has generalized abdominal tenderness, slightly more pronounced to the right lower quadrant, attendings evaluation.  He is very tachycardic with a regular rate and rhythm, normal upper and lower extremity pulses are symmetric and 2+.  Presentation is not consistent with dissection.  He is nontoxic-appearing but does appear uncomfortable, afebrile and does not meet sepsis criteria. -BP (!) 148/68   Pulse (!) 115   Temp 98.2 F (36.8 C) (Oral)   Resp 15   SpO2 97%   I ordered, viewed and interpreted laboratory workup as documented ED course.  Patient has a new AKI and findings consistent with dehydration.  Also has a leukocytosis with left shift.  EKG shows sinus tachycardia any specific ischemic changes, do not suspect ACS especially in the context of no chest pain and negative troponin.    Chest x-ray is negative for any infectious etiology such as pneumonia, also no widened mediastinum.  I have  ordered 2 L lactated ringer fluid bolus as well as Zofran.  Patient is on cardiac monitoring in sinus tachycardia.    Patient's tachycardia midly improved but still mildly tachycardic 102 on the cardiac monitor per my interpretation.   CT with findings consistent with gastroenteritis.  No acute intra-abdominal process.  Agree with radiologist.  I reevaluated patient he feels significantly improved after fluid bolus.  We discussed the CT result and copy was provided.    Workup most consistent with dehydration secondary to gastroenteritis without any acute emergent process.  I considered admission but given patient's improvement after fluid bolus and ability to tolerate p.o. I think is reasonable to have him follow-up outpatient with strict return precautions.  Stable for discharge at this time.  Discussed HPI, physical exam and plan of care for this patient with attending Octaviano Glow. The attending physician evaluated this patient as part of a shared visit and agrees with plan of care.         Final Clinical Impression(s) / ED Diagnoses Final diagnoses:  Gastroenteritis  Dehydration    Rx / DC Orders ED Discharge Orders          Ordered    ondansetron (ZOFRAN-ODT) 8 MG disintegrating tablet  Every 8 hours PRN        05/15/22 1016              Sherrill Raring, [place] 05/15/22 1032    Wyvonnia Dusky, MD 05/15/22 1222

## 2022-05-15 NOTE — ED Triage Notes (Signed)
Pt reports N/V/D after eating [employer] and drinking beers last night

## 2022-05-16 ENCOUNTER — Telehealth: Payer: Self-pay

## 2022-05-16 NOTE — Telephone Encounter (Signed)
Transition Care Management Unsuccessful Follow-up Telephone Call  Date of discharge and from where:  [facility] 1.21.24  Attempts:  1st Attempt  Reason for unsuccessful TCM follow-up call:  Left voice message

## 2022-05-17 NOTE — Telephone Encounter (Signed)
Transition Care Management Unsuccessful Follow-up Telephone Call  Date of discharge and from where:  [facility] 1.21.24  Attempts:  2nd Attempt  Reason for unsuccessful TCM follow-up call:  Left voice message

## 2022-05-18 NOTE — Telephone Encounter (Signed)
Transition Care Management Unsuccessful Follow-up Telephone Call  Date of discharge and from where:  [facility] 1.21.24  Attempts:  3rd Attempt  Reason for unsuccessful TCM follow-up call:  Left voice message

## 2022-06-02 ENCOUNTER — Other Ambulatory Visit: Payer: Self-pay | Admitting: Nurse Practitioner

## 2022-06-02 DIAGNOSIS — J4540 Moderate persistent asthma, uncomplicated: Secondary | ICD-10-CM

## 2022-06-10 ENCOUNTER — Encounter: Payer: Self-pay | Admitting: Nurse Practitioner

## 2022-06-29 NOTE — Progress Notes (Unsigned)
    Jonathan Sims T. Jonathan Buhl, MD, [place] at [facility] [place], [address]  Phone: [phone]  FAX: [place] - 37 y.o. male  MRN [id number]  Date of Birth: 11/03/1985  Date: 06/30/2022  PCP: Jonathan Pitcher, NP  Referral: Jonathan Pitcher, NP  No chief complaint on file.  Subjective:   Jonathan Sims is a 37 y.o. very pleasant male patient with There is no height or weight on file to calculate BMI. who presents with the following:  Patient presents with ongoing knee pain.    Review of Systems is noted in the HPI, as appropriate  Objective:   There were no vitals taken for this visit.  GEN: No acute distress; alert,appropriate. PULM: Breathing comfortably in no respiratory distress PSYCH: Normally interactive.   Laboratory and Imaging Data:  Assessment and Plan:   ***

## 2022-06-30 ENCOUNTER — Ambulatory Visit: Payer: BC Managed Care – PPO | Admitting: Family Medicine

## 2022-06-30 ENCOUNTER — Encounter: Payer: Self-pay | Admitting: Family Medicine

## 2022-06-30 VITALS — BP 120/80 | HR 81 | Temp 97.9°F | Ht 70.0 in | Wt 271.0 lb

## 2022-06-30 DIAGNOSIS — M25562 Pain in left knee: Secondary | ICD-10-CM

## 2022-06-30 MED ORDER — CELECOXIB 200 MG PO CAPS: 200.0000 mg | ORAL_CAPSULE | Freq: Every day | ORAL | 2 refills | Status: DC

## 2022-10-07 ENCOUNTER — Other Ambulatory Visit: Payer: Self-pay | Admitting: Family Medicine

## 2022-10-07 NOTE — Telephone Encounter (Signed)
Last office visit 06/30/2022 for knee pain with Dr. Patsy Lager.  Last refilled 06/30/2022 for #30 with 2 refills.  Next Appt: No future appointments.

## 2022-10-09 ENCOUNTER — Other Ambulatory Visit: Payer: Self-pay | Admitting: Nurse Practitioner

## 2022-10-09 DIAGNOSIS — J4540 Moderate persistent asthma, uncomplicated: Secondary | ICD-10-CM

## 2022-10-10 NOTE — Telephone Encounter (Signed)
Can we schedule the patient for CPE per my last note. Then send medication back for refill please

## 2022-10-10 NOTE — Telephone Encounter (Signed)
Patient scheduled for cpe in first available 9/19/ at 2:20 pm

## 2022-12-08 ENCOUNTER — Encounter (INDEPENDENT_AMBULATORY_CARE_PROVIDER_SITE_OTHER): Payer: Self-pay

## 2023-01-12 ENCOUNTER — Encounter: Payer: BC Managed Care – PPO | Admitting: Nurse Practitioner

## 2023-01-20 ENCOUNTER — Encounter: Payer: Self-pay | Admitting: Nurse Practitioner

## 2023-01-23 ENCOUNTER — Other Ambulatory Visit: Payer: Self-pay | Admitting: Nurse Practitioner

## 2023-01-23 DIAGNOSIS — J4540 Moderate persistent asthma, uncomplicated: Secondary | ICD-10-CM

## 2023-01-23 NOTE — Telephone Encounter (Signed)
Can we get the patient scheduled for a CPE within the next 30 days

## 2023-01-24 NOTE — Telephone Encounter (Signed)
Spoke with patient he is getting added on to October 16 for his CPE.

## 2023-02-08 ENCOUNTER — Encounter: Payer: Self-pay | Admitting: Nurse Practitioner

## 2023-02-08 ENCOUNTER — Ambulatory Visit (INDEPENDENT_AMBULATORY_CARE_PROVIDER_SITE_OTHER): Payer: PRIVATE HEALTH INSURANCE | Admitting: Nurse Practitioner

## 2023-02-08 VITALS — BP 128/70 | HR 87 | Temp 98.3°F | Ht 72.0 in | Wt 280.6 lb

## 2023-02-08 DIAGNOSIS — J4540 Moderate persistent asthma, uncomplicated: Secondary | ICD-10-CM | POA: Diagnosis not present

## 2023-02-08 DIAGNOSIS — Z79899 Other long term (current) drug therapy: Secondary | ICD-10-CM | POA: Diagnosis not present

## 2023-02-08 DIAGNOSIS — Z6838 Body mass index (BMI) 38.0-38.9, adult: Secondary | ICD-10-CM | POA: Diagnosis not present

## 2023-02-08 DIAGNOSIS — Z13220 Encounter for screening for lipoid disorders: Secondary | ICD-10-CM

## 2023-02-08 DIAGNOSIS — E66811 Obesity, class 1: Secondary | ICD-10-CM

## 2023-02-08 DIAGNOSIS — Z0000 Encounter for general adult medical examination without abnormal findings: Secondary | ICD-10-CM

## 2023-02-08 DIAGNOSIS — J45909 Unspecified asthma, uncomplicated: Secondary | ICD-10-CM

## 2023-02-08 MED ORDER — BUDESONIDE-FORMOTEROL FUMARATE 160-4.5 MCG/ACT IN AERO: 2.0000 | INHALATION_SPRAY | Freq: Two times a day (BID) | RESPIRATORY_TRACT | 5 refills | Status: AC

## 2023-02-08 NOTE — Assessment & Plan Note (Signed)
Patient currently maintained on Symbicort maintenance inhaler and albuterol inhaler as needed.  Patient use albuterol more as of late as he ran out Symbicort.  Prior to that maybe 4 times a year use.  Refills provided today continue therapy as prescribed

## 2023-02-08 NOTE — Assessment & Plan Note (Signed)
Pending TSH, lipid panel, A1c.  Continue working healthy lifestyle modifications inclusive of exercise

## 2023-02-08 NOTE — Assessment & Plan Note (Signed)
Check magnesium and B12 will try to wean off PPI therapy.

## 2023-02-08 NOTE — Assessment & Plan Note (Signed)
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  Did review patient's personal, surgical, social, family histories.Patient is a today on all age-appropriate vaccinations he would like.  Patient will defer flu vaccine to his employer.  Patient is too young for CRC screening or prostate cancer screening.  Patient was given information at discharge about preventative healthcare maintenance with anticipatory guidance.

## 2023-02-08 NOTE — Assessment & Plan Note (Signed)
Patient will use Zyrtec over-the-counter

## 2023-02-08 NOTE — Patient Instructions (Signed)
Nice to see you today Cut the esomeprazole down to 20mg  daily for the next month then try and come off Follow up with me in 1 year, sooner If you need me

## 2023-02-08 NOTE — Progress Notes (Signed)
Established Patient Office Visit  Subjective   Patient ID: Jonathan Sims, male    DOB: 01/27/1986  Age: 37 y.o. MRN: [id number]  Chief Complaint  Patient presents with   Annual Exam    HPI  Asthma: he was on the symnicort and albuterol as needed. Stats he has been using the albuterol more. States that he does not use it at all   GERD: has been on nexium 40 for a while   for complete physical and follow up of chronic conditions.  Immunizations: -Tetanus: Completed in 2023 -Influenza: defer to employer  -Shingles: Too young -Pneumonia: Too young  Diet: Fair diet. 3 meals and some snacks. States he will do coffee in the am and will drinks soda some water  Exercise: No regular exercise. States that he has been treadmill  once a week for approx  30-45 mins on the treadmill  and yard work  Eye exam: Completes annually. Can use glasses if needed   Dental exam: Completes semi-annually    Colonoscopy: too young currently average risk  Lung Cancer Screening: NA  PSA: Too young, currently average risk  Sleep:he will go to bed aroudn 12 and get up at 440. Does not feel rested. Does snore     Review of Systems  Constitutional:  Negative for chills and fever.  Respiratory:  Negative for shortness of breath.   Cardiovascular:  Positive for chest pain. Negative for leg swelling.  Gastrointestinal:  Negative for abdominal pain, blood in stool, constipation, diarrhea, nausea and vomiting.       BM daily   Genitourinary:  Negative for dysuria and hematuria.  Neurological:  Negative for tingling and headaches.  Psychiatric/Behavioral:  Negative for hallucinations and suicidal ideas.       Objective:     BP 128/70   Pulse 87   Temp 98.3 F (36.8 C) (Oral)   Ht 6' (1.829 m)   Wt 280 lb 9.6 oz (127.3 kg)   SpO2 95%   BMI 38.06 kg/m  BP Readings from Last 3 Encounters:  02/08/23 128/70  06/30/22 120/80  05/15/22 123/80   Wt Readings from Last 3 Encounters:  02/08/23  280 lb 9.6 oz (127.3 kg)  06/30/22 271 lb (122.9 kg)  05/11/22 278 lb (126.1 kg)   SpO2 Readings from Last 3 Encounters:  02/08/23 95%  06/30/22 99%  05/15/22 100%      Physical Exam Vitals and nursing note reviewed.  Constitutional:      Appearance: Normal appearance.  HENT:     Right Ear: Tympanic membrane, ear canal and external ear normal.     Left Ear: Tympanic membrane, ear canal and external ear normal.     Mouth/Throat:     Mouth: Mucous membranes are moist.     Pharynx: Oropharynx is clear.  Eyes:     Extraocular Movements: Extraocular movements intact.     Pupils: Pupils are equal, round, and reactive to light.  Cardiovascular:     Rate and Rhythm: Normal rate and regular rhythm.     Pulses: Normal pulses.     Heart sounds: Normal heart sounds.  Pulmonary:     Effort: Pulmonary effort is normal.     Breath sounds: Normal breath sounds.  Abdominal:     General: Bowel sounds are normal. There is no distension.     Palpations: There is no mass.     Tenderness: There is no abdominal tenderness.     Hernia: No hernia is present.  Musculoskeletal:     Right lower leg: No edema.     Left lower leg: No edema.  Lymphadenopathy:     Cervical: No cervical adenopathy.  Skin:    General: Skin is warm.  Neurological:     General: No focal deficit present.     Mental Status: He is alert.     Deep Tendon Reflexes:     Reflex Scores:      Bicep reflexes are 2+ on the right side and 2+ on the left side.      Patellar reflexes are 2+ on the right side and 2+ on the left side.    Comments: Bilateral upper and lower extremity strength 5/5  Psychiatric:        Mood and Affect: Mood normal.        Behavior: Behavior normal.        Thought Content: Thought content normal.        Judgment: Judgment normal.      No results found for any visits on 02/08/23.    The ASCVD Risk score (Arnett DK, et al., 2019) failed to calculate for the following reasons:   The 2019 ASCVD  risk score is only valid for ages 48 to 2    Assessment & Plan:   Problem List Items Addressed This Visit       Respiratory   Moderate persistent asthma, uncomplicated    Patient currently maintained on Symbicort maintenance inhaler and albuterol inhaler as needed.  Patient use albuterol more as of late as he ran out Symbicort.  Prior to that maybe 4 times a year use.  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  Did review patient's personal, surgical, social, family histories.Patient is a today on all age-appropriate vaccinations he would like.  Patient will defer flu vaccine to his employer.  Patient is too young for CRC screening or prostate cancer screening.  Patient was given information at discharge about preventative healthcare maintenance with anticipatory guidance.      Relevant Orders   CBC   Comprehensive metabolic panel   TSH   Obesity (BMI 30.0-34.9)    Pending TSH, lipid panel, A1c.  Continue working healthy lifestyle modifications inclusive of exercise      Relevant Orders   Hemoglobin A1c   Lipid panel   Long-term current use of proton pump inhibitor therapy    Check magnesium and B12 will try to wean off PPI therapy.      Relevant Orders   Magnesium   Vitamin B12   Other Visit Diagnoses     Screening for lipid disorders       Relevant Orders   Lipid panel       Return in about 1 year (around 02/08/2024) for CPE and Labs.    Audria Nine, NP

## 2023-02-09 LAB — CBC
HCT: 46.1 % (ref 39.0–52.0)
Hemoglobin: 15.1 g/dL (ref 13.0–17.0)
MCHC: 32.8 g/dL (ref 30.0–36.0)
MCV: 91.9 fL (ref 78.0–100.0)
Platelets: 225 10*3/uL (ref 150.0–400.0)
RBC: 5.02 Mil/uL (ref 4.22–5.81)
RDW: 13.6 % (ref 11.5–15.5)
WBC: 9.8 10*3/uL (ref 4.0–10.5)

## 2023-02-09 LAB — COMPREHENSIVE METABOLIC PANEL
ALT: 45 U/L (ref 0–53)
AST: 27 U/L (ref 0–37)
Albumin: 4.2 g/dL (ref 3.5–5.2)
Alkaline Phosphatase: 49 U/L (ref 39–117)
BUN: 13 mg/dL (ref 6–23)
CO2: 29 meq/L (ref 19–32)
Calcium: 9.3 mg/dL (ref 8.4–10.5)
Chloride: 103 meq/L (ref 96–112)
Creatinine, Ser: 0.99 mg/dL (ref 0.40–1.50)
GFR: 97.04 mL/min (ref >60.00)
Glucose, Bld: 84 mg/dL (ref 70–99)
Potassium: 3.9 meq/L (ref 3.5–5.1)
Sodium: 140 meq/L (ref 135–145)
Total Bilirubin: 1.6 mg/dL — ABNORMAL HIGH (ref 0.2–1.2)
Total Protein: 6.7 g/dL (ref 6.0–8.3)

## 2023-02-09 LAB — LIPID PANEL
Cholesterol: 149 mg/dL (ref 0–200)
HDL: 49.7 mg/dL (ref >39.00)
LDL Cholesterol: 72 mg/dL (ref 0–99)
NonHDL: 98.97
Total CHOL/HDL Ratio: 3
Triglycerides: 133 mg/dL (ref 0.0–149.0)
VLDL: 26.6 mg/dL (ref 0.0–40.0)

## 2023-02-09 LAB — VITAMIN B12: Vitamin B-12: 369 pg/mL (ref 211–911)

## 2023-02-09 LAB — HEMOGLOBIN A1C: Hgb A1c MFr Bld: 5.6 % (ref 4.6–6.5)

## 2023-02-09 LAB — TSH: TSH: 2.68 u[IU]/mL (ref 0.35–5.50)

## 2023-02-09 LAB — MAGNESIUM: Magnesium: 1.9 mg/dL (ref 1.5–2.5)

## 2023-02-11 ENCOUNTER — Encounter: Payer: Self-pay | Admitting: Nurse Practitioner

## 2023-11-13 ENCOUNTER — Other Ambulatory Visit: Payer: Self-pay | Admitting: Urgent Care

## 2023-11-13 ENCOUNTER — Other Ambulatory Visit: Payer: Self-pay

## 2023-11-13 ENCOUNTER — Ambulatory Visit
Admission: RE | Admit: 2023-11-13 | Discharge: 2023-11-13 | Disposition: A | Discharge status: Home / Self Care | Source: Ambulatory Visit | Attending: Urgent Care | Admitting: Urgent Care

## 2023-11-13 DIAGNOSIS — N200 Calculus of kidney: Secondary | ICD-10-CM
